# Patient Record
Sex: Male | Born: 1980 | Race: Black or African American | Hispanic: No | Marital: Single | Smoking: Former smoker
Health system: Southern US, Community
[De-identification: ages and names within clinical notes are randomized; demographics above are authoritative.]

## PROBLEM LIST (undated history)

## (undated) DIAGNOSIS — E079 Disorder of thyroid, unspecified: Secondary | ICD-10-CM

## (undated) DIAGNOSIS — J45909 Unspecified asthma, uncomplicated: Secondary | ICD-10-CM

## (undated) DIAGNOSIS — K859 Acute pancreatitis without necrosis or infection, unspecified: Secondary | ICD-10-CM

## (undated) HISTORY — DX: Disorder of thyroid, unspecified: E07.9

---

## 2002-12-30 ENCOUNTER — Emergency Department (HOSPITAL_COMMUNITY): Admission: EM | Admit: 2002-12-30 | Discharge: 2002-12-31 | Payer: Self-pay | Admitting: Emergency Medicine

## 2007-08-24 ENCOUNTER — Emergency Department (HOSPITAL_COMMUNITY): Admission: EM | Admit: 2007-08-24 | Discharge: 2007-08-24 | Payer: Self-pay | Admitting: Emergency Medicine

## 2011-07-16 ENCOUNTER — Encounter (HOSPITAL_COMMUNITY): Payer: Self-pay | Admitting: Emergency Medicine

## 2011-07-16 ENCOUNTER — Emergency Department (INDEPENDENT_AMBULATORY_CARE_PROVIDER_SITE_OTHER): Payer: 59

## 2011-07-16 ENCOUNTER — Emergency Department (HOSPITAL_COMMUNITY)
Admission: EM | Admit: 2011-07-16 | Discharge: 2011-07-16 | Disposition: A | Discharge status: Home / Self Care | Payer: 59 | Source: Home / Self Care | Attending: Family Medicine | Admitting: Family Medicine

## 2011-07-16 DIAGNOSIS — M62838 Other muscle spasm: Secondary | ICD-10-CM

## 2011-07-16 HISTORY — DX: Unspecified asthma, uncomplicated: J45.909

## 2011-07-16 MED ORDER — CYCLOBENZAPRINE HCL 10 MG PO TABS: 10.0000 mg | ORAL_TABLET | Freq: Three times a day (TID) | ORAL | Status: AC | PRN

## 2011-07-16 MED ORDER — IBUPROFEN 600 MG PO TABS: 600.0000 mg | ORAL_TABLET | Freq: Three times a day (TID) | ORAL | Status: AC

## 2011-07-16 NOTE — ED Notes (Addendum)
Pt here with c/o right clavicle knot without pain or bruising. r posterior shoulder blade discomfort that he noticed x4 dys ago.denies injury or strain.

## 2011-07-17 NOTE — ED Provider Notes (Signed)
History     CSN: [id number]  Arrival date & time 07/16/11  1110   First MD Initiated Contact with Patient 07/16/11 1123      Chief Complaint  Patient presents with  . Cyst    (Consider location/radiation/quality/duration/timing/severity/associated sxs/prior treatment) HPI Comments: 31 y/o male h/o asthma here c/o deformity in right collar bone. Also reports discomfort with movement in right upper back for 4 days. He was looking at the mirror due to discomfort in right upper back and noticed difference between collar bones. Denies clavicular pain. Denies recent falls or known trauma. Works in call center answering the phone. Has not taken any pain medications denies any other symptoms.    Past Medical History  Diagnosis Date  . Asthma     History reviewed. No pertinent past surgical history.  No family history on file.  History  Substance Use Topics  . Smoking status: Never Smoker   . Smokeless tobacco: Not on file  . Alcohol Use: Yes      Review of Systems  Constitutional: Negative for fever, chills, activity change, appetite change, fatigue and unexpected weight change.       10 systems reviewed and  pertinent negative and positive symptoms are as per HPI.     HENT: Negative for neck pain.   Cardiovascular: Negative for chest pain.  Musculoskeletal: Positive for back pain.       As per HPI.  Skin: Negative for rash.  Neurological: Negative for weakness.  All other systems reviewed and are negative.    Allergies  Review of patient's allergies indicates no known allergies.  Home Medications   Current Outpatient Rx  Name Route Sig Dispense Refill  . CYCLOBENZAPRINE HCL 10 MG PO TABS Oral Take 1 tablet (10 mg total) by mouth 3 (three) times daily as needed for muscle spasms. 20 tablet 0  . IBUPROFEN 600 MG PO TABS Oral Take 1 tablet (600 mg total) by mouth 3 (three) times daily. 20 tablet 0    BP 149/89  Pulse 112  Temp 97.8 F (36.6 C) (Oral)  Resp 16   SpO2 98%  Physical Exam  Nursing note and vitals reviewed. Constitutional: He is oriented to person, place, and time. He appears well-developed and well-nourished. No distress.  HENT:  Head: Normocephalic and atraumatic.  Mouth/Throat: Oropharynx is clear and moist. No oropharyngeal exudate.  Eyes: Conjunctivae and EOM are normal. Pupils are equal, round, and reactive to light. No scleral icterus.  Neck: Normal range of motion. Neck supple. No tracheal deviation present.  Cardiovascular: Normal rate, regular rhythm and normal heart sounds.   Pulmonary/Chest: Effort normal and breath sounds normal. No respiratory distress. He has no wheezes. He has no rales. He exhibits no tenderness.  Musculoskeletal:       There is asymmetry of right collar bone that appears more prominent distally while compared with left side. No associated swelling or bruising. No tenderness or crepitus to palpation.  Right shoulder with FROM. No prominent scapula while leaning against wall with palms supporting body weight. Right upper back: impress asymmetric contraction and tenderness to palpation of muscles between medial upper border of right scapula and thoracic vertebrae compared with left side. Although both scapula appears rotating symmetrically during arms abduction and adduction. Possible mild spine scoliosis with no kyphosis.    Neurological: He is alert and oriented to person, place, and time.  Skin: No rash noted.    ED Course  Procedures (including critical care time)  Labs Reviewed -  No data to display Dg Chest 1 View  07/16/2011  *RADIOLOGY REPORT*  Clinical Data: Right clavicle deformity  CHEST - 1 VIEW  Comparison: None.  Findings: The left and right medial clavicles appear normal without significant asymmetry. The sternum clavicular joints appear normal.  Normal mediastinum and cardiac silhouette.  Normal pulmonary vasculature.  No evidence of effusion, infiltrate, or pneumothorax. No acute bony  abnormality.  IMPRESSION: No radiographic abnormality of the clavicles.  Original Report Authenticated By: Genevive Bi, M.D.     1. Muscle spasm       MDM  Symptoms and clinical finding possible related to scoliosis. Treated with flexeril asked to follow up with orthopedic as needed.         Sharin Grave, MD 07/17/11 [id number]

## 2012-11-10 ENCOUNTER — Ambulatory Visit: Payer: Self-pay

## 2013-06-12 ENCOUNTER — Emergency Department (HOSPITAL_COMMUNITY)
Admission: EM | Admit: 2013-06-12 | Discharge: 2013-06-12 | Disposition: A | Discharge status: Home / Self Care | Payer: Managed Care, Other (non HMO) | Source: Home / Self Care | Attending: Family Medicine | Admitting: Family Medicine

## 2013-06-12 ENCOUNTER — Encounter (HOSPITAL_COMMUNITY): Payer: Self-pay | Admitting: Emergency Medicine

## 2013-06-12 DIAGNOSIS — K859 Acute pancreatitis without necrosis or infection, unspecified: Secondary | ICD-10-CM

## 2013-06-12 DIAGNOSIS — H6690 Otitis media, unspecified, unspecified ear: Secondary | ICD-10-CM

## 2013-06-12 LAB — COMPREHENSIVE METABOLIC PANEL
ALK PHOS: 198 U/L — AB (ref 39–117)
ALT: 35 U/L (ref 0–53)
AST: 23 U/L (ref 0–37)
Albumin: 3.7 g/dL (ref 3.5–5.2)
BILIRUBIN TOTAL: 1.1 mg/dL (ref 0.3–1.2)
BUN: 15 mg/dL (ref 6–23)
CHLORIDE: 105 meq/L (ref 96–112)
CO2: 24 meq/L (ref 19–32)
Calcium: 9.7 mg/dL (ref 8.4–10.5)
Creatinine, Ser: 0.52 mg/dL (ref 0.50–1.35)
GLUCOSE: 104 mg/dL — AB (ref 70–99)
POTASSIUM: 4 meq/L (ref 3.7–5.3)
SODIUM: 143 meq/L (ref 137–147)
TOTAL PROTEIN: 7.4 g/dL (ref 6.0–8.3)

## 2013-06-12 LAB — CBC
HEMATOCRIT: 36.7 % — AB (ref 39.0–52.0)
HEMOGLOBIN: 12 g/dL — AB (ref 13.0–17.0)
MCH: 26.9 pg (ref 26.0–34.0)
MCHC: 32.7 g/dL (ref 30.0–36.0)
MCV: 82.3 fL (ref 78.0–100.0)
Platelets: 206 10*3/uL (ref 150–400)
RBC: 4.46 MIL/uL (ref 4.22–5.81)
RDW: 13.5 % (ref 11.5–15.5)
WBC: 9.2 10*3/uL (ref 4.0–10.5)

## 2013-06-12 LAB — POCT RAPID STREP A: Streptococcus, Group A Screen (Direct): NEGATIVE

## 2013-06-12 LAB — LIPASE, BLOOD: LIPASE: 153 U/L — AB (ref 11–59)

## 2013-06-12 MED ORDER — ONDANSETRON HCL 4 MG/2ML IJ SOLN
4.0000 mg | Freq: Once | INTRAMUSCULAR | Status: AC
  Administered 2013-06-12: 4 mg via INTRAVENOUS

## 2013-06-12 MED ORDER — HYDROCODONE-ACETAMINOPHEN 5-325 MG PO TABS: 1.0000 | ORAL_TABLET | Freq: Four times a day (QID) | ORAL | Status: DC | PRN

## 2013-06-12 MED ORDER — ONDANSETRON HCL 4 MG/2ML IJ SOLN
INTRAMUSCULAR | Status: AC
  Filled 2013-06-12: qty 2

## 2013-06-12 MED ORDER — ANTIPYRINE-BENZOCAINE 5.4-1.4 % OT SOLN
3.0000 [drp] | OTIC | Status: DC | PRN
  Administered 2013-06-12: 3 [drp] via OTIC

## 2013-06-12 MED ORDER — SODIUM CHLORIDE 0.9 % IV BOLUS (SEPSIS)
1000.0000 mL | Freq: Once | INTRAVENOUS | Status: AC
Start: 2013-06-12 — End: 2013-06-12
  Administered 2013-06-12: 1000 mL via INTRAVENOUS

## 2013-06-12 MED ORDER — SODIUM CHLORIDE 0.9 % IV BOLUS (SEPSIS)
1000.0000 mL | Freq: Once | INTRAVENOUS | Status: AC
  Administered 2013-06-12: 1000 mL via INTRAVENOUS

## 2013-06-12 MED ORDER — AMOXICILLIN 500 MG PO CAPS: 1000.0000 mg | ORAL_CAPSULE | Freq: Two times a day (BID) | ORAL | Status: DC

## 2013-06-12 MED ORDER — ONDANSETRON HCL 8 MG PO TABS: 8.0000 mg | ORAL_TABLET | Freq: Three times a day (TID) | ORAL | Status: DC | PRN

## 2013-06-12 NOTE — ED Notes (Signed)
#  2 liter NSS hung at 999 ml/H

## 2013-06-12 NOTE — ED Provider Notes (Addendum)
Greg Patrick is a 33 y.o. male who presents to Urgent Care today for ear pain vomiting and fever and fatigue. Symptoms present for one day. Ibuprofen is not helped. No diarrhea chest pain palpitations or shortness of breath. Patient feels well otherwise.   Past Medical History  Diagnosis Date  . Asthma    History  Substance Use Topics  . Smoking status: Current Every Day Smoker  . Smokeless tobacco: Not on file  . Alcohol Use: Yes   ROS as above Medications: Current Facility-Administered Medications  Medication Dose Route Frequency Provider Last Rate Last Dose  . antipyrine-benzocaine (AURALGAN) otic solution 3-4 drop  3-4 drop Right Ear Q2H PRN Rodolph BongEvan S Becka Lagasse, MD   3 drop at 06/12/13 1216   Current Outpatient Prescriptions  Medication Sig Dispense Refill  . amoxicillin (AMOXIL) 500 MG capsule Take 2 capsules (1,000 mg total) by mouth 2 (two) times daily.  28 capsule  0  . HYDROcodone-acetaminophen (NORCO/VICODIN) 5-325 MG per tablet Take 1 tablet by mouth every 6 (six) hours as needed.  15 tablet  0  . ondansetron (ZOFRAN) 8 MG tablet Take 1 tablet (8 mg total) by mouth every 8 (eight) hours as needed for nausea or vomiting.  20 tablet  0    Exam:  BP 147/80  Pulse 132  Temp(Src) 101.6 F (38.7 C) (Oral)  Resp 26  SpO2 96% Filed Vitals:   06/12/13 1128 06/12/13 1353  BP: 163/83 147/80  Pulse: 94 132  Temp: 98.4 F (36.9 C) 101.6 F (38.7 C)  TempSrc: Oral Oral  Resp: 20 26  SpO2: 96%     Heart rate as high as 130 beats per minute Gen: Well NAD HEENT: EOMI,  MMM right tympanic membrane is erythematous without effusion left is normal. Posterior pharynx normal-appearing. Lungs: Normal work of breathing. CTABL Heart: RRR no MRG Abd: NABS, Soft.  ND to palpation epigastric area no rebound or guarding. No CVA tenderness to percussion Exts: Brisk capillary refill, warm and well perfused.   Patient was given 2 L of normal saline. He noted mild improvement he remained  significantly tachycardic.  Results for orders placed during the hospital encounter of 06/12/13 (from the past 24 hour(s))  CBC     Status: Abnormal   Collection Time    06/12/13 12:01 PM      Result Value Ref Range   WBC 9.2  4.0 - 10.5 K/uL   RBC 4.46  4.22 - 5.81 MIL/uL   Hemoglobin 12.0 (*) 13.0 - 17.0 g/dL   HCT 16.136.7 (*) 09.639.0 - 04.552.0 %   MCV 82.3  78.0 - 100.0 fL   MCH 26.9  26.0 - 34.0 pg   MCHC 32.7  30.0 - 36.0 g/dL   RDW 40.913.5  81.111.5 - 91.415.5 %   Platelets 206  150 - 400 K/uL  COMPREHENSIVE METABOLIC PANEL     Status: Abnormal   Collection Time    06/12/13 12:01 PM      Result Value Ref Range   Sodium 143  137 - 147 mEq/L   Potassium 4.0  3.7 - 5.3 mEq/L   Chloride 105  96 - 112 mEq/L   CO2 24  19 - 32 mEq/L   Glucose, Bld 104 (*) 70 - 99 mg/dL   BUN 15  6 - 23 mg/dL   Creatinine, Ser 7.820.52  0.50 - 1.35 mg/dL   Calcium 9.7  8.4 - 95.610.5 mg/dL   Total Protein 7.4  6.0 - 8.3 g/dL  Albumin 3.7  3.5 - 5.2 g/dL   AST 23  0 - 37 U/L   ALT 35  0 - 53 U/L   Alkaline Phosphatase 198 (*) 39 - 117 U/L   Total Bilirubin 1.1  0.3 - 1.2 mg/dL   GFR calc non Af Amer >90  >90 mL/min   GFR calc Af Amer >90  >90 mL/min  LIPASE, BLOOD     Status: Abnormal   Collection Time    06/12/13 12:01 PM      Result Value Ref Range   Lipase 153 (*) 11 - 59 U/L  POCT RAPID STREP A ([facility] ONLY)     Status: None   Collection Time    06/12/13 12:26 PM      Result Value Ref Range   Streptococcus, Group A Screen (Direct) NEGATIVE  NEGATIVE   No results found.  Assessment and Plan: 33 y.o. male with  pancreatitis. Patient failed to improve following 2 L of normal saline. He is febrile and tachycardic. Plan to transfer to the emergency room for further evaluation and management.   Rodolph Bong, MD 06/12/13 1357   Addendum: Patient refused transfer. He agrees to return tomorrow morning for recheck and reevaluation. Discussed warning signs or symptoms. Expressed understanding and  agreement.  Rodolph Bong, MD 06/12/13 [phone]

## 2013-06-12 NOTE — ED Notes (Signed)
MD advised of new VS readings

## 2013-06-12 NOTE — ED Notes (Signed)
Pain in right ear, vomiting since this AM. NAD

## 2013-06-12 NOTE — ED Notes (Signed)
MD advised of change in VS, MD advised pt of needs being best served in ED. Patient declined to go to ED presently. Both he and wife have been instructed to go to ED if condition worsens in any way, if new syx develop, or if he is unable to keep down PO fluids. They seemed agreeable to this plan

## 2013-06-12 NOTE — Discharge Instructions (Signed)
Thank you for coming in today. Clear liquid diet.  Return tomorrow morning.  Go to the Emergency Room if you worsen.  If your belly pain worsens, or you have high fever, bad vomiting, blood in your stool or black tarry stool go to the Emergency Room.    Acute Pancreatitis Acute pancreatitis is a disease in which the pancreas becomes suddenly inflamed. The pancreas is a large gland located behind your stomach. The pancreas produces enzymes that help digest food. The pancreas also releases the hormones glucagon and insulin that help regulate blood sugar. Damage to the pancreas occurs when the digestive enzymes from the pancreas are activated and begin attacking the pancreas before being released into the intestine. Most acute attacks last a couple of days and can cause serious complications. Some people become dehydrated and develop low blood pressure. In severe cases, bleeding into the pancreas can lead to shock and can be life-threatening. The lungs, heart, and kidneys may fail. CAUSES  Pancreatitis can happen to anyone. In some cases, the cause is unknown. Most cases are caused by:  Alcohol abuse.  Gallstones. Other less common causes are:  Certain medicines.  Exposure to certain chemicals.  Infection.  Damage caused by an accident (trauma).  Abdominal surgery. SYMPTOMS   Pain in the upper abdomen that may radiate to the back.  Tenderness and swelling of the abdomen.  Nausea and vomiting. DIAGNOSIS  Your caregiver will perform a physical exam. Blood and stool tests may be done to confirm the diagnosis. Imaging tests may also be done, such as X-rays, CT scans, or an ultrasound of the abdomen. TREATMENT  Treatment usually requires a stay in the hospital. Treatment may include:  Pain medicine.  Fluid replacement through an intravenous line (IV).  Placing a tube in the stomach to remove stomach contents and control vomiting.  Not eating for 3 or 4 days. This gives your  pancreas a rest, because enzymes are not being produced that can cause further damage.  Antibiotic medicines if your condition is caused by an infection.  Surgery of the pancreas or gallbladder. HOME CARE INSTRUCTIONS   Follow the diet advised by your caregiver. This may involve avoiding alcohol and decreasing the amount of fat in your diet.  Eat smaller, more frequent meals. This reduces the amount of digestive juices the pancreas produces.  Drink enough fluids to keep your urine clear or pale yellow.  Only take over-the-counter or prescription medicines as directed by your caregiver.  Avoid drinking alcohol if it caused your condition.  Do not smoke.  Get plenty of rest.  Check your blood sugar at home as directed by your caregiver.  Keep all follow-up appointments as directed by your caregiver. SEEK MEDICAL CARE IF:   You do not recover as quickly as expected.  You develop new or worsening symptoms.  You have persistent pain, weakness, or nausea.  You recover and then have another episode of pain. SEEK IMMEDIATE MEDICAL CARE IF:   You are unable to eat or keep fluids down.  Your pain becomes severe.  You have a fever or persistent symptoms for more than 2 to 3 days.  You have a fever and your symptoms suddenly get worse.  Your skin or the white part of your eyes turn yellow (jaundice).  You develop vomiting.  You feel dizzy, or you faint.  Your blood sugar is high (over 300 mg/dL). MAKE SURE YOU:   Understand these instructions.  Will watch your condition.  Will get  help right away if you are not doing well or get worse. Document Released: 12/21/2004 Document Revised: 06/22/2011 Document Reviewed: 04/01/2011 [facility] Patient Information 2014 [place], [place].  Diet The clear liquid diet consists of foods that are liquid or will become liquid at room temperature. Examples of foods allowed on a clear liquid diet include fruit juice, broth or bouillon,  gelatin, or frozen ice pops. You should be able to see through the liquid. The purpose of this diet is to provide the necessary fluids, electrolytes (such as sodium and potassium), and energy to keep the body functioning during times when you are not able to consume a regular diet. A clear liquid diet should not be continued for long periods of time, as it is not nutritionally adequate.  A CLEAR LIQUID DIET MAY BE NEEDED: When a sudden-onset (acute) condition occurs before or after surgery.  As the first step in oral feeding.  For fluid and electrolyte replacement in diarrheal diseases.  As a diet before certain medical tests are performed.  ADEQUACY The clear liquid diet is adequate only in ascorbic acid, according to the Recommended Dietary Allowances of the [employer].  CHOOSING FOODS Breads and Starches Allowed: None are allowed.  Avoid: All are to be avoided.  Vegetables Allowed: Strained vegetable juices.  Avoid: Any others.  Fruit Allowed: Strained fruit juices and fruit drinks. Include 1 serving of citrus or vitamin C-enriched fruit juice daily.  Avoid: Any others.  Meat and Meat Substitutes Allowed: None are allowed.  Avoid: All are to be avoided.  Milk Products Allowed: None are allowed.  Avoid: All are to be avoided.  Soups and Combination Foods Allowed: Clear bouillon, broth, or strained broth-based soups.  Avoid: Any others.  Desserts and Sweets Allowed: Sugar, honey. High-protein gelatin. Flavored gelatin, ices, or frozen ice pops that do not contain milk.  Avoid: Any others.  Fats and Oils Allowed: None are allowed.  Avoid: All are to be avoided.  Beverages Allowed: Cereal beverages, coffee (regular or decaffeinated), tea, or soda at the discretion of your health care provider.  Avoid: Any others.  Condiments Allowed: Salt.  Avoid: Any others, including pepper.  Supplements Allowed: Liquid  nutrition beverages that you can see through.  Avoid: Any others that contain lactose or fiber. SAMPLE MEAL PLAN Breakfast 4 oz (120 mL) strained orange juice.  to 1 cup (120 to 240 mL) gelatin (plain or fortified). 1 cup (240 mL) beverage (coffee or tea). Sugar, if desired. Midmorning Snack  cup (120 mL) gelatin (plain or fortified). Lunch 1 cup (240 mL) broth or consomm. 4 oz (120 mL) strained grapefruit juice.  cup (120 mL) gelatin (plain or fortified). 1 cup (240 mL) beverage (coffee or tea). Sugar, if desired. Midafternoon Snack  cup (120 mL) fruit ice.  cup (120 mL) strained fruit juice. Dinner 1 cup (240 mL) broth or consomm.  cup (120 mL) cranberry juice.  cup (120 mL) flavored gelatin (plain or fortified). 1 cup (240 mL) beverage (coffee or tea). Sugar, if desired. Evening Snack 4 oz (120 mL) strained apple juice (vitamin C-fortified).  cup (120 mL) flavored gelatin (plain or fortified). MAKE SURE YOU: Understand these instructions. Will watch your child's condition. Will get help right away if your child is not doing well or gets worse. Document Released: 12/21/2004 Document Revised: 08/23/2012 Document Reviewed: 05/23/2012 [facility] Patient Information 2014 [place], [place].

## 2013-06-14 ENCOUNTER — Emergency Department (HOSPITAL_COMMUNITY): Payer: Managed Care, Other (non HMO)

## 2013-06-14 ENCOUNTER — Emergency Department (HOSPITAL_COMMUNITY)
Admission: EM | Admit: 2013-06-14 | Discharge: 2013-06-15 | Disposition: A | Discharge status: Home / Self Care | Payer: Managed Care, Other (non HMO) | Attending: Emergency Medicine | Admitting: Emergency Medicine

## 2013-06-14 ENCOUNTER — Encounter (HOSPITAL_COMMUNITY): Payer: Self-pay | Admitting: Emergency Medicine

## 2013-06-14 DIAGNOSIS — R1011 Right upper quadrant pain: Secondary | ICD-10-CM | POA: Insufficient documentation

## 2013-06-14 DIAGNOSIS — H6690 Otitis media, unspecified, unspecified ear: Secondary | ICD-10-CM | POA: Insufficient documentation

## 2013-06-14 DIAGNOSIS — K839 Disease of biliary tract, unspecified: Secondary | ICD-10-CM

## 2013-06-14 DIAGNOSIS — R509 Fever, unspecified: Secondary | ICD-10-CM | POA: Insufficient documentation

## 2013-06-14 DIAGNOSIS — H6091 Unspecified otitis externa, right ear: Secondary | ICD-10-CM

## 2013-06-14 DIAGNOSIS — K838 Other specified diseases of biliary tract: Secondary | ICD-10-CM | POA: Insufficient documentation

## 2013-06-14 DIAGNOSIS — Z8719 Personal history of other diseases of the digestive system: Secondary | ICD-10-CM | POA: Insufficient documentation

## 2013-06-14 DIAGNOSIS — R000 Tachycardia, unspecified: Secondary | ICD-10-CM | POA: Insufficient documentation

## 2013-06-14 DIAGNOSIS — R112 Nausea with vomiting, unspecified: Secondary | ICD-10-CM | POA: Insufficient documentation

## 2013-06-14 DIAGNOSIS — F17200 Nicotine dependence, unspecified, uncomplicated: Secondary | ICD-10-CM | POA: Insufficient documentation

## 2013-06-14 DIAGNOSIS — R1013 Epigastric pain: Secondary | ICD-10-CM | POA: Insufficient documentation

## 2013-06-14 DIAGNOSIS — J45909 Unspecified asthma, uncomplicated: Secondary | ICD-10-CM | POA: Insufficient documentation

## 2013-06-14 HISTORY — DX: Acute pancreatitis without necrosis or infection, unspecified: K85.90

## 2013-06-14 LAB — URINALYSIS, ROUTINE W REFLEX MICROSCOPIC
Glucose, UA: NEGATIVE mg/dL
HGB URINE DIPSTICK: NEGATIVE
Ketones, ur: NEGATIVE mg/dL
Leukocytes, UA: NEGATIVE
NITRITE: NEGATIVE
PROTEIN: 30 mg/dL — AB
SPECIFIC GRAVITY, URINE: 1.026 (ref 1.005–1.030)
UROBILINOGEN UA: 1 mg/dL (ref 0.0–1.0)
pH: 5.5 (ref 5.0–8.0)

## 2013-06-14 LAB — CULTURE, GROUP A STREP

## 2013-06-14 LAB — URINE MICROSCOPIC-ADD ON

## 2013-06-14 LAB — CBC WITH DIFFERENTIAL/PLATELET
Basophils Absolute: 0 10*3/uL (ref 0.0–0.1)
Basophils Relative: 0 % (ref 0–1)
EOS ABS: 0 10*3/uL (ref 0.0–0.7)
EOS PCT: 0 % (ref 0–5)
HCT: 34.6 % — ABNORMAL LOW (ref 39.0–52.0)
HEMOGLOBIN: 11.3 g/dL — AB (ref 13.0–17.0)
LYMPHS ABS: 1.1 10*3/uL (ref 0.7–4.0)
LYMPHS PCT: 17 % (ref 12–46)
MCH: 26.7 pg (ref 26.0–34.0)
MCHC: 32.7 g/dL (ref 30.0–36.0)
MCV: 81.6 fL (ref 78.0–100.0)
MONOS PCT: 13 % — AB (ref 3–12)
Monocytes Absolute: 0.9 10*3/uL (ref 0.1–1.0)
Neutro Abs: 4.7 10*3/uL (ref 1.7–7.7)
Neutrophils Relative %: 70 % (ref 43–77)
Platelets: 209 10*3/uL (ref 150–400)
RBC: 4.24 MIL/uL (ref 4.22–5.81)
RDW: 13.6 % (ref 11.5–15.5)
WBC: 6.7 10*3/uL (ref 4.0–10.5)

## 2013-06-14 LAB — COMPREHENSIVE METABOLIC PANEL
ALK PHOS: 150 U/L — AB (ref 39–117)
ALT: 57 U/L — AB (ref 0–53)
AST: 37 U/L (ref 0–37)
Albumin: 3.4 g/dL — ABNORMAL LOW (ref 3.5–5.2)
BUN: 9 mg/dL (ref 6–23)
CALCIUM: 9.4 mg/dL (ref 8.4–10.5)
CO2: 24 meq/L (ref 19–32)
Chloride: 105 mEq/L (ref 96–112)
Creatinine, Ser: 0.5 mg/dL (ref 0.50–1.35)
GLUCOSE: 104 mg/dL — AB (ref 70–99)
POTASSIUM: 3.6 meq/L — AB (ref 3.7–5.3)
SODIUM: 143 meq/L (ref 137–147)
TOTAL PROTEIN: 7.3 g/dL (ref 6.0–8.3)
Total Bilirubin: 1 mg/dL (ref 0.3–1.2)

## 2013-06-14 LAB — LIPASE, BLOOD: LIPASE: 15 U/L (ref 11–59)

## 2013-06-14 MED ORDER — SODIUM CHLORIDE 0.9 % IV BOLUS (SEPSIS)
1000.0000 mL | Freq: Once | INTRAVENOUS | Status: AC
  Administered 2013-06-14: 1000 mL via INTRAVENOUS

## 2013-06-14 MED ORDER — IOHEXOL 300 MG/ML  SOLN
50.0000 mL | Freq: Once | INTRAMUSCULAR | Status: AC | PRN
  Administered 2013-06-14: 50 mL via ORAL

## 2013-06-14 MED ORDER — IOHEXOL 300 MG/ML  SOLN
80.0000 mL | Freq: Once | INTRAMUSCULAR | Status: AC | PRN
  Administered 2013-06-14: 80 mL via INTRAVENOUS

## 2013-06-14 NOTE — ED Notes (Signed)
Patient back from CT dept Patient in NAD Awaiting CT results

## 2013-06-14 NOTE — ED Notes (Signed)
Throat culture: Strep beta hemolytic not group A.  Pt. adequately treated with Amoxicillin. Greg Patrick 06/14/2013

## 2013-06-14 NOTE — ED Notes (Signed)
Pt seen on Monday at Urgent care- treated for pancreatitis and rt ear infection. Pt here for re-evaluation of these complaints. C/o of N/V/and fever. Fever controlled with motrin.

## 2013-06-14 NOTE — ED Notes (Signed)
Patient rates RUQ pain 7/10 Patient initially declined pain medication or to have EDP informed This nurse reminded patient that during [place] that palpation to abdomen would occur Patient now states that he would like pain medication EDP made aware

## 2013-06-14 NOTE — ED Provider Notes (Signed)
CSN: [id number]     Arrival date & time 06/14/13  1831 History   First MD Initiated Contact with Patient 06/14/13 2105     Chief Complaint  Patient presents with  . Abdominal Pain  . Otalgia  . Ear Drainage  . Nausea  . Emesis     (Consider location/radiation/quality/duration/timing/severity/associated sxs/prior Treatment) Patient is a 33 y.o. male presenting with abdominal pain, ear pain, ear drainage, and vomiting. The history is provided by the patient. No language interpreter was used.  Abdominal Pain Pain location:  LUQ Pain quality: aching   Pain radiates to:  Does not radiate Pain severity:  Moderate Onset quality:  Gradual Duration:  4 days Timing:  Constant Progression:  Worsening Chronicity:  New Context: alcohol use   Context comment:  Former Ineffective treatments:  NSAIDs Associated symptoms: fever, nausea and vomiting   Risk factors comment:     Otalgia Location:  Right Quality:  Throbbing Progression:  Waxing and waning Chronicity:  New Associated symptoms: abdominal pain, fever and vomiting   Ear Drainage Associated symptoms include abdominal pain, a fever, nausea and vomiting.  Emesis Associated symptoms: abdominal pain     Past Medical History  Diagnosis Date  . Asthma   . Pancreatitis    History reviewed. No pertinent past surgical history. No family history on file. History  Substance Use Topics  . Smoking status: Current Every Day Smoker  . Smokeless tobacco: Not on file  . Alcohol Use: Yes    Review of Systems  Constitutional: Positive for fever.  HENT: Positive for ear pain.   Gastrointestinal: Positive for nausea, vomiting and abdominal pain.  All other systems reviewed and are negative.     Allergies  Review of patient's allergies indicates no known allergies.  Home Medications   Prior to Admission medications   Medication Sig Start Date End Date Taking? Authorizing Provider  amoxicillin (AMOXIL) 500 MG capsule Take 2  capsules (1,000 mg total) by mouth 2 (two) times daily. 06/12/13   Rodolph Bong, MD  HYDROcodone-acetaminophen (NORCO/VICODIN) 5-325 MG per tablet Take 1 tablet by mouth every 6 (six) hours as needed. 06/12/13   Rodolph Bong, MD  ondansetron (ZOFRAN) 8 MG tablet Take 1 tablet (8 mg total) by mouth every 8 (eight) hours as needed for nausea or vomiting. 06/12/13   Rodolph Bong, MD   BP 146/80  Pulse 109  Temp(Src) 98.1 F (36.7 C) (Oral)  Resp 18  Ht 5\' 11"  (1.803 m)  Wt 163 lb (73.936 kg)  BMI 22.74 kg/m2  SpO2 98% Physical Exam  Nursing note and vitals reviewed. Constitutional: He is oriented to person, place, and time. He appears well-developed and well-nourished.  HENT:  Head: Normocephalic.  Right Ear: There is drainage, swelling and tenderness.  External canal edematous.  Eyes: Pupils are equal, round, and reactive to light.  Neck: Normal range of motion.  Cardiovascular: Intact distal pulses.  Tachycardia present.   Pulmonary/Chest: Effort normal and breath sounds normal.  Abdominal: Soft. There is tenderness.  Musculoskeletal: He exhibits no edema and no tenderness.  Lymphadenopathy:    He has no cervical adenopathy.  Neurological: He is alert and oriented to person, place, and time.  Skin: Skin is warm and dry.  Psychiatric: He has a normal mood and affect.    ED Course  Procedures (including critical care time) Labs Review Labs Reviewed  CBC WITH DIFFERENTIAL - Abnormal; Notable for the following:    Hemoglobin 11.3 (*)  HCT 34.6 (*)    Monocytes Relative 13 (*)    All other components within normal limits  URINALYSIS, ROUTINE W REFLEX MICROSCOPIC - Abnormal; Notable for the following:    Color, Urine AMBER (*)    Bilirubin Urine SMALL (*)    Protein, ur 30 (*)    All other components within normal limits  URINE MICROSCOPIC-ADD ON - Abnormal; Notable for the following:    Casts WBC CAST (*)    All other components within normal limits  COMPREHENSIVE METABOLIC  PANEL  LIPASE, BLOOD    Imaging Review No results found.   EKG Interpretation None     Radiology and lab results reviewed and shared with patient.  No leukocytosis or fever.  Improved lipase.  ALT 54, AlkPhos 150.  Ultrasound RUQ reveals GB wall thickening with pericholecystic edema without stones or sludge.  Patient remains mildly tachycardic, normotensive.  Patient reports history of persistent tachycardia.  Patient has adequate pain and nausea medication at home.  Will add ciprodex to otalgia treatment regimen.  Follow-up with GI, [facility], and PCP. MDM   Final diagnoses:  None    Otitis externa. Epigastric pain.    Jimmye Normanavid John Osiah Haring, NP 06/15/13 [phone]

## 2013-06-15 ENCOUNTER — Telehealth (HOSPITAL_COMMUNITY): Payer: Self-pay | Admitting: *Deleted

## 2013-06-15 ENCOUNTER — Emergency Department (HOSPITAL_COMMUNITY): Payer: Managed Care, Other (non HMO)

## 2013-06-15 MED ORDER — CIPROFLOXACIN-DEXAMETHASONE 0.3-0.1 % OT SUSP: 4.0000 [drp] | Freq: Two times a day (BID) | OTIC | Status: DC

## 2013-06-15 MED ORDER — HYDROMORPHONE HCL PF 1 MG/ML IJ SOLN
1.0000 mg | Freq: Once | INTRAMUSCULAR | Status: AC
  Administered 2013-06-15: 1 mg via INTRAVENOUS
  Filled 2013-06-15: qty 1

## 2013-06-15 MED ORDER — ONDANSETRON HCL 4 MG/2ML IJ SOLN
4.0000 mg | Freq: Once | INTRAMUSCULAR | Status: AC
  Administered 2013-06-15: 4 mg via INTRAVENOUS
  Filled 2013-06-15: qty 2

## 2013-06-15 MED ORDER — MORPHINE SULFATE 4 MG/ML IJ SOLN: 4.0000 mg | Freq: Once | INTRAMUSCULAR | Status: DC

## 2013-06-15 NOTE — ED Notes (Signed)
US at bedside

## 2013-06-15 NOTE — ED Notes (Addendum)
I called and left a message to call. Call 1. Greg Patrick, Greg Patrick 06/15/2013 Left message.  Call 2. 06/18/2013 Left message.  Call 3. 06/20/2013 Unable to reach pt. by phone x 3.  Confidential marked letter with lab results sent to pt. Greg Patrick, Greg Patrick 06/21/2013

## 2013-06-15 NOTE — ED Notes (Signed)
Patient resting in position of comfort with eyes closed s/p admin of pain medication, see MAR RR WNL--even and unlabored with equal rise and fall of chest Patient in NAD Side rails up, call bell in reach  Awaiting [place]

## 2013-06-15 NOTE — ED Notes (Signed)
US completed Patient asking for water Patient informed that he needs to remain NPO until US results come back and are reviewed by EDP--patient agreed and v/u to plan of care Patient denied further needs or complaints at this time Side rails up, call bell in reach

## 2013-06-15 NOTE — Discharge Instructions (Signed)
Abdominal Pain, Adult Many things can cause abdominal pain. Usually, abdominal pain is not caused by a disease and will improve without treatment. It can often be observed and treated at home. Your health care provider will do a physical exam and possibly order blood tests and X-rays to help determine the seriousness of your pain. However, in many cases, more time must pass before a clear cause of the pain can be found. Before that point, your health care provider may not know if you need more testing or further treatment. HOME CARE INSTRUCTIONS  Monitor your abdominal pain for any changes. The following actions may help to alleviate any discomfort you are experiencing:  Only take over-the-counter or prescription medicines as directed by your health care provider.  Do not take laxatives unless directed to do so by your health care provider.  Try a clear liquid diet (broth, tea, or water) as directed by your health care provider. Slowly move to a bland diet as tolerated. SEEK MEDICAL CARE IF:  You have unexplained abdominal pain.  You have abdominal pain associated with nausea or diarrhea.  You have pain when you urinate or have a bowel movement.  You experience abdominal pain that wakes you in the night.  You have abdominal pain that is worsened or improved by eating food.  You have abdominal pain that is worsened with eating fatty foods. SEEK IMMEDIATE MEDICAL CARE IF:   Your pain does not go away within 2 hours.  You have a fever.  You keep throwing up (vomiting).  Your pain is felt only in portions of the abdomen, such as the right side or the left lower portion of the abdomen.  You pass bloody or black tarry stools. MAKE SURE YOU:  Understand these instructions.   Will watch your condition.   Will get help right away if you are not doing well or get worse.  Document Released: 09/30/2004 Document Revised: 10/11/2012 Document Reviewed: 08/30/2012 [facility] Patient  Information 2014 [place], [place]. Otitis Externa Otitis externa is a bacterial or fungal infection of the outer ear canal. This is the area from the eardrum to the outside of the ear. Otitis externa is sometimes called "swimmer's ear." CAUSES  Possible causes of infection include:  Swimming in dirty water.  Moisture remaining in the ear after swimming or bathing.  Mild injury (trauma) to the ear.  Objects stuck in the ear (foreign body).  Cuts or scrapes (abrasions) on the outside of the ear. SYMPTOMS  The first symptom of infection is often itching in the ear canal. Later signs and symptoms may include swelling and redness of the ear canal, ear pain, and yellowish-white fluid (pus) coming from the ear. The ear pain may be worse when pulling on the earlobe. DIAGNOSIS  Your caregiver will perform a physical exam. A sample of fluid may be taken from the ear and examined for bacteria or fungi. TREATMENT  Antibiotic ear drops are often given for 10 to 14 days. Treatment may also include pain medicine or corticosteroids to reduce itching and swelling. PREVENTION   Keep your ear dry. Use the corner of a towel to absorb water out of the ear canal after swimming or bathing.  Avoid scratching or putting objects inside your ear. This can damage the ear canal or remove the protective wax that lines the canal. This makes it easier for bacteria and fungi to grow.  Avoid swimming in lakes, polluted water, or poorly chlorinated pools.  You may use ear drops  made of rubbing alcohol and vinegar after swimming. Combine equal parts of white vinegar and alcohol in a bottle. Put 3 or 4 drops into each ear after swimming. HOME CARE INSTRUCTIONS   Apply antibiotic ear drops to the ear canal as prescribed by your caregiver.  Only take over-the-counter or prescription medicines for pain, discomfort, or fever as directed by your caregiver.  If you have diabetes, follow any additional treatment instructions  from your caregiver.  Keep all follow-up appointments as directed by your caregiver. SEEK MEDICAL CARE IF:   You have a fever.  Your ear is still red, swollen, painful, or draining pus after 3 days.  Your redness, swelling, or pain gets worse.  You have a severe headache.  You have redness, swelling, pain, or tenderness in the area behind your ear. MAKE SURE YOU:   Understand these instructions.  Will watch your condition.  Will get help right away if you are not doing well or get worse. Document Released: 12/21/2004 Document Revised: 03/15/2011 Document Reviewed: 01/07/2011 [facility] Patient Information 2014 [place], [place].

## 2013-06-16 NOTE — ED Provider Notes (Signed)
Medical screening examination/treatment/procedure(s) were conducted as a shared visit with non-physician practitioner(s) and myself.  I personally evaluated the patient during the encounter.   EKG Interpretation None      Patient here with epigastric pain. On exam, only epigastric pain, no RUQ pain. CT shows pericholecystic fluid. No white count, no fever. Will do RUQ US.  Dagmar HaitWilliam Bonnie Overdorf, MD 06/16/13 [phone]

## 2014-05-24 ENCOUNTER — Inpatient Hospital Stay (HOSPITAL_COMMUNITY)
Admission: EM | Admit: 2014-05-24 | Discharge: 2014-05-26 | DRG: 644 | Disposition: A | Discharge status: Home / Self Care | Payer: PRIVATE HEALTH INSURANCE | Attending: Internal Medicine | Admitting: Internal Medicine

## 2014-05-24 ENCOUNTER — Emergency Department (HOSPITAL_COMMUNITY): Payer: PRIVATE HEALTH INSURANCE

## 2014-05-24 ENCOUNTER — Encounter (HOSPITAL_COMMUNITY): Payer: Self-pay

## 2014-05-24 DIAGNOSIS — I517 Cardiomegaly: Secondary | ICD-10-CM | POA: Diagnosis present

## 2014-05-24 DIAGNOSIS — E049 Nontoxic goiter, unspecified: Secondary | ICD-10-CM | POA: Diagnosis present

## 2014-05-24 DIAGNOSIS — R000 Tachycardia, unspecified: Secondary | ICD-10-CM

## 2014-05-24 DIAGNOSIS — I429 Cardiomyopathy, unspecified: Secondary | ICD-10-CM

## 2014-05-24 DIAGNOSIS — R0602 Shortness of breath: Secondary | ICD-10-CM

## 2014-05-24 DIAGNOSIS — F17210 Nicotine dependence, cigarettes, uncomplicated: Secondary | ICD-10-CM | POA: Diagnosis present

## 2014-05-24 DIAGNOSIS — R0789 Other chest pain: Secondary | ICD-10-CM

## 2014-05-24 DIAGNOSIS — R9431 Abnormal electrocardiogram [ECG] [EKG]: Secondary | ICD-10-CM | POA: Diagnosis present

## 2014-05-24 DIAGNOSIS — Z79899 Other long term (current) drug therapy: Secondary | ICD-10-CM

## 2014-05-24 DIAGNOSIS — R1013 Epigastric pain: Secondary | ICD-10-CM

## 2014-05-24 DIAGNOSIS — Z791 Long term (current) use of non-steroidal anti-inflammatories (NSAID): Secondary | ICD-10-CM

## 2014-05-24 DIAGNOSIS — G737 Myopathy in diseases classified elsewhere: Secondary | ICD-10-CM | POA: Diagnosis present

## 2014-05-24 DIAGNOSIS — J45909 Unspecified asthma, uncomplicated: Secondary | ICD-10-CM | POA: Diagnosis present

## 2014-05-24 DIAGNOSIS — E0500 Thyrotoxicosis with diffuse goiter without thyrotoxic crisis or storm: Principal | ICD-10-CM | POA: Diagnosis present

## 2014-05-24 DIAGNOSIS — E0590 Thyrotoxicosis, unspecified without thyrotoxic crisis or storm: Secondary | ICD-10-CM | POA: Diagnosis present

## 2014-05-24 DIAGNOSIS — R079 Chest pain, unspecified: Secondary | ICD-10-CM | POA: Diagnosis not present

## 2014-05-24 DIAGNOSIS — I43 Cardiomyopathy in diseases classified elsewhere: Secondary | ICD-10-CM | POA: Diagnosis present

## 2014-05-24 DIAGNOSIS — I4581 Long QT syndrome: Secondary | ICD-10-CM | POA: Diagnosis present

## 2014-05-24 DIAGNOSIS — E010 Iodine-deficiency related diffuse (endemic) goiter: Secondary | ICD-10-CM

## 2014-05-24 LAB — COMPREHENSIVE METABOLIC PANEL
ALK PHOS: 248 U/L — AB (ref 38–126)
ALT: 25 U/L (ref 17–63)
AST: 24 U/L (ref 15–41)
Albumin: 3.8 g/dL (ref 3.5–5.0)
Anion gap: 10 (ref 5–15)
BUN: 12 mg/dL (ref 6–20)
CO2: 25 mmol/L (ref 22–32)
Calcium: 9 mg/dL (ref 8.9–10.3)
Chloride: 108 mmol/L (ref 101–111)
Creatinine, Ser: 0.61 mg/dL (ref 0.61–1.24)
Glucose, Bld: 107 mg/dL — ABNORMAL HIGH (ref 65–99)
Potassium: 3.8 mmol/L (ref 3.5–5.1)
Sodium: 143 mmol/L (ref 135–145)
Total Bilirubin: 0.9 mg/dL (ref 0.3–1.2)
Total Protein: 7.5 g/dL (ref 6.5–8.1)

## 2014-05-24 LAB — URINALYSIS, ROUTINE W REFLEX MICROSCOPIC
Bilirubin Urine: NEGATIVE
GLUCOSE, UA: NEGATIVE mg/dL
HGB URINE DIPSTICK: NEGATIVE
Ketones, ur: NEGATIVE mg/dL
LEUKOCYTES UA: NEGATIVE
Nitrite: NEGATIVE
Protein, ur: NEGATIVE mg/dL
SPECIFIC GRAVITY, URINE: 1.009 (ref 1.005–1.030)
Urobilinogen, UA: 1 mg/dL (ref 0.0–1.0)
pH: 7 (ref 5.0–8.0)

## 2014-05-24 LAB — CBC
HEMATOCRIT: 40.4 % (ref 39.0–52.0)
HEMOGLOBIN: 12.8 g/dL — AB (ref 13.0–17.0)
MCH: 26.4 pg (ref 26.0–34.0)
MCHC: 31.7 g/dL (ref 30.0–36.0)
MCV: 83.5 fL (ref 78.0–100.0)
Platelets: 176 10*3/uL (ref 150–400)
RBC: 4.84 MIL/uL (ref 4.22–5.81)
RDW: 14.7 % (ref 11.5–15.5)
WBC: 5.1 10*3/uL (ref 4.0–10.5)

## 2014-05-24 LAB — D-DIMER, QUANTITATIVE: D-Dimer, Quant: 0.62 ug/mL-FEU — ABNORMAL HIGH (ref 0.00–0.48)

## 2014-05-24 LAB — BRAIN NATRIURETIC PEPTIDE: B NATRIURETIC PEPTIDE 5: 483 pg/mL — AB (ref 0.0–100.0)

## 2014-05-24 LAB — I-STAT TROPONIN, ED: TROPONIN I, POC: 0 ng/mL (ref 0.00–0.08)

## 2014-05-24 LAB — LIPASE, BLOOD: Lipase: 13 U/L — ABNORMAL LOW (ref 22–51)

## 2014-05-24 MED ORDER — IOHEXOL 350 MG/ML SOLN
100.0000 mL | Freq: Once | INTRAVENOUS | Status: AC | PRN
Start: 2014-05-24 — End: 2014-05-24
  Administered 2014-05-24: 100 mL via INTRAVENOUS

## 2014-05-24 MED ORDER — FUROSEMIDE 10 MG/ML IJ SOLN
40.0000 mg | Freq: Once | INTRAMUSCULAR | Status: AC
  Administered 2014-05-24: 40 mg via INTRAVENOUS
  Filled 2014-05-24: qty 4

## 2014-05-24 MED ORDER — GI COCKTAIL ~~LOC~~
30.0000 mL | Freq: Once | ORAL | Status: AC
  Administered 2014-05-24: 30 mL via ORAL
  Filled 2014-05-24: qty 30

## 2014-05-24 NOTE — ED Provider Notes (Signed)
CSN: [id number]     Arrival date & time 05/24/14  1723 History   First MD Initiated Contact with Patient 05/24/14 1929     Chief Complaint  Patient presents with  . Chest Pain  . Abdominal Pain  . Shortness of Breath     (Consider location/radiation/quality/duration/timing/severity/associated sxs/prior Treatment) HPI Comments: Greg Patrick is a 34 y.o. male with a PMHx of asthma and pancreatitis, who presents to the ED with complaints of intermittent chest tightness and shortness of breath, as well as intermittent abdominal pain 3 days. He states that he developed some mild sinus congestion and a dry cough, and mild chest tightness in the mornings accompanied by SOB, which he attributed to his environmental allergies and started taking Sudafed which helped his symptoms. He states that his chest tightness and shortness of breath are worse in the morning, and come intermittently throughout the day, denying that he has any symptoms currently. He denies that he has chest pain, only tightness. He describes his abdominal pain as 3/10 intermittent tightness in the epigastrum, nonradiating, worse with eating, and relieved with ibuprofen. He denies any fevers, chills, sore throat, ear pain or drainage, eye symptoms, wheezing, hemoptysis, diaphoresis, lightheadedness, jaw or neck pain, back pain, nausea, vomiting, diarrhea, constipation, melena, hematochezia, dysuria, hematuria, lower extremity swelling, recent travel/surgery/immobilization, history of DVT, numbness, tingling, weakness, headache, vision changes, claudication, PND, or orthopnea. He smokes occasionally. Denies any family history of cardiac disease. Denies any personal history of cardiac disease, hypertension, hyperlipidemia, or diabetes.  Patient is a 34 y.o. male presenting with abdominal pain and shortness of breath. The history is provided by the patient. No language interpreter was used.  Abdominal Pain Pain location:  Epigastric Pain  quality comment:  Tightness Pain radiates to:  Does not radiate Pain severity:  Mild Onset quality:  Gradual Duration:  3 days Timing:  Intermittent Progression:  Unchanged Chronicity:  New Context: recent illness (sinus congestion, cough)   Context: not recent travel   Relieved by:  NSAIDs Worsened by:  Eating Ineffective treatments:  None tried Associated symptoms: cough (dry) and shortness of breath (intermittent)   Associated symptoms: no chest pain, no chills, no constipation, no diarrhea, no dysuria, no fever, no hematemesis, no hematochezia, no hematuria, no melena, no nausea, no sore throat and no vomiting   Shortness of Breath Associated symptoms: abdominal pain (intermittent epigastric) and cough (dry)   Associated symptoms: no chest pain, no diaphoresis, no ear pain, no fever, no headaches, no neck pain, no rash, no sore throat, no vomiting and no wheezing     Past Medical History  Diagnosis Date  . Asthma   . Pancreatitis    History reviewed. No pertinent past surgical history. History reviewed. No pertinent family history. History  Substance Use Topics  . Smoking status: Current Every Day Smoker    Types: Cigarettes  . Smokeless tobacco: Not on file  . Alcohol Use: Yes    Review of Systems  Constitutional: Negative for fever, chills and diaphoresis.  HENT: Positive for sinus pressure. Negative for ear discharge, ear pain, rhinorrhea, sore throat and trouble swallowing.   Eyes: Negative for pain, discharge, itching and visual disturbance.  Respiratory: Positive for cough (dry), chest tightness (intermittent) and shortness of breath (intermittent). Negative for wheezing.   Cardiovascular: Negative for chest pain and leg swelling.  Gastrointestinal: Positive for abdominal pain (intermittent epigastric). Negative for nausea, vomiting, diarrhea, constipation, blood in stool, melena, hematochezia and hematemesis.  Genitourinary: Negative for dysuria and hematuria.  Musculoskeletal: Negative for myalgias, back pain, arthralgias and neck pain.  Skin: Negative for color change and rash.  Allergic/Immunologic: Negative for immunocompromised state.  Neurological: Negative for dizziness, weakness, light-headedness, numbness and headaches.  Psychiatric/Behavioral: Negative for confusion.   10 Systems reviewed and are negative for acute change except as noted in the HPI.    Allergies  Review of patient's allergies indicates no known allergies.  Home Medications   Prior to Admission medications   Medication Sig Start Date End Date Taking? Authorizing Provider  ibuprofen (ADVIL,MOTRIN) 200 MG tablet Take 600 mg by mouth every 6 (six) hours as needed for moderate pain.   Yes Historical Provider, MD  Nutritional Supplements (COLD AND FLU PO) Take 2 capsules by mouth every 6 (six) hours as needed (cold and flu symptoms).   Yes Historical Provider, MD  amoxicillin (AMOXIL) 500 MG capsule Take 2 capsules (1,000 mg total) by mouth 2 (two) times daily. Patient not taking: Reported on 05/24/2014 06/12/13   Gregor Hams, MD  ciprofloxacin-dexamethasone [facility]) otic suspension Place 4 drops into the right ear 2 (two) times daily. Patient not taking: Reported on 05/24/2014 06/15/13   Etta Quill, NP  HYDROcodone-acetaminophen (NORCO/VICODIN) 5-325 MG per tablet Take 1 tablet by mouth every 6 (six) hours as needed. Patient not taking: Reported on 05/24/2014 06/12/13   Gregor Hams, MD  ondansetron (ZOFRAN) 8 MG tablet Take 1 tablet (8 mg total) by mouth every 8 (eight) hours as needed for nausea or vomiting. Patient not taking: Reported on 05/24/2014 06/12/13   Gregor Hams, MD   BP 157/91 mmHg  Pulse 111  Temp(Src) 98.6 F (37 C) (Oral)  Resp 22  SpO2 96% Physical Exam  Constitutional: He is oriented to person, place, and time. Vital signs are normal. He appears well-developed and well-nourished.  Non-toxic appearance. No distress.  Afebrile, nontoxic, NAD  HENT:   Head: Normocephalic and atraumatic.  Nose: Mucosal edema present.  Mouth/Throat: Uvula is midline, oropharynx is clear and moist and mucous membranes are normal. No trismus in the jaw. No uvula swelling.  Slight nasal mucosal edema bilaterally  Eyes: Conjunctivae and EOM are normal. Right eye exhibits no discharge. Left eye exhibits no discharge.  Neck: Normal range of motion. Neck supple. JVD present. Thyromegaly present.  Slight JVD noted  Thyromegaly noted upon reassessment when pt was lying at an angle  Cardiovascular: Regular rhythm, normal heart sounds and intact distal pulses.  Tachycardia present.  Exam reveals no gallop and no friction rub.   No murmur heard. Tachycardic, reg rhythm, nl s1/s2, no m/r/g, distal pulses intact, 1-2+ b/l pitting edema   Pulmonary/Chest: Effort normal and breath sounds normal. No respiratory distress. He has no decreased breath sounds. He has no wheezes. He has no rhonchi. He has no rales. He exhibits no tenderness, no crepitus, no deformity and no retraction.  CTAB in all lung fields, no w/r/r, no hypoxia or increased WOB, speaking in full sentences, SpO2 96% on RA Chest wall nonTTP without crepitus or deformity  Abdominal: Soft. Normal appearance and bowel sounds are normal. He exhibits no distension and no pulsatile midline mass. There is tenderness in the epigastric area. There is no rigidity, no rebound, no guarding, no CVA tenderness, no tenderness at McBurney's point and negative Murphy's sign.    Soft, nondistended, with no midline pulsatile mass, +BS throughout, with very mild epigastric TTP, no r/g/r, neg murphy's, neg mcburney's, no CVA TTP   Musculoskeletal: Normal range of motion.  Right lower leg: He exhibits edema.       Left lower leg: He exhibits edema.  MAE x4 Strength and sensation grossly intact Distal pulses intact 1-2+ bilateral LE pitting edema, neg homan's bilaterally   Neurological: He is alert and oriented to person,  place, and time. He has normal strength. No sensory deficit.  Skin: Skin is warm, dry and intact. No rash noted.  No erythema or warmth to BLEs  Psychiatric: He has a normal mood and affect.  Nursing note and vitals reviewed.   ED Course  Procedures (including critical care time) Labs Review Labs Reviewed  CBC - Abnormal; Notable for the following:    Hemoglobin 12.8 (*)    All other components within normal limits  BRAIN NATRIURETIC PEPTIDE - Abnormal; Notable for the following:    B Natriuretic Peptide 483.0 (*)    All other components within normal limits  LIPASE, BLOOD - Abnormal; Notable for the following:    Lipase 13 (*)    All other components within normal limits  COMPREHENSIVE METABOLIC PANEL - Abnormal; Notable for the following:    Glucose, Bld 107 (*)    Alkaline Phosphatase 248 (*)    All other components within normal limits  D-DIMER, QUANTITATIVE - Abnormal; Notable for the following:    D-Dimer, Quant 0.62 (*)    All other components within normal limits  URINALYSIS, ROUTINE W REFLEX MICROSCOPIC  TSH  TROPONIN I  I-STAT TROPOININ, ED    Imaging Review Dg Chest 2 View (if Patient Has Fever And/or Copd)  05/24/2014   CLINICAL DATA:  Chest pain, shortness of breath, dry cough for 3 days. Smoker.  EXAM: CHEST  2 VIEW  COMPARISON:  07/16/2011  FINDINGS: Cardiomegaly. No overt edema. No effusions or focal airspace opacities. No acute bony abnormality.  IMPRESSION: Cardiomegaly.  No acute disease.   Electronically Signed   By: Rolm Baptise M.D.   On: 05/24/2014 18:22   Ct Angio Chest Pe W/cm &/or Wo Cm  05/24/2014   CLINICAL DATA:  Acute onset of intermittent generalized chest tightness and upper abdominal tightness. Shortness of breath. Initial encounter.  EXAM: CT ANGIOGRAPHY CHEST WITH CONTRAST  TECHNIQUE: Multidetector CT imaging of the chest was performed using the standard protocol during bolus administration of intravenous contrast. Multiplanar CT image  reconstructions and MIPs were obtained to evaluate the vascular anatomy.  CONTRAST:  167m OMNIPAQUE IOHEXOL 350 MG/ML SOLN  COMPARISON:  Chest radiograph from 05/24/2014  FINDINGS: There is no evidence of pulmonary embolus.  Minimal left basilar atelectasis or scarring is noted. A few tiny blebs are noted near the right lung base. The lungs are otherwise clear. There is no evidence of significant focal consolidation, pleural effusion or pneumothorax. No masses are identified; no abnormal focal contrast enhancement is seen.  A 8 mm right peribronchial node remains borderline normal in size. No hilar or mediastinal lymphadenopathy is seen. No pericardial effusion is identified. Residual thymic tissue is within normal limits. The great vessels are grossly unremarkable in appearance. No axillary lymphadenopathy is seen.  The thyroid gland is diffusely enlarged, likely reflecting thyroid goiter.  The visualized portions of the liver and spleen are unremarkable.  No acute osseous abnormalities are seen.  Review of the MIP images confirms the above findings.  IMPRESSION: 1. No evidence of pulmonary embolus. 2. Minimal left basilar atelectasis or scarring noted. Few tiny blebs near the right lung base. Lungs otherwise clear. 3. Diffusely enlarged thyroid gland likely reflects thyroid goiter.  Electronically Signed   By: Garald Balding M.D.   On: 05/24/2014 23:59     EKG Interpretation   Date/Time:  Friday May 24 2014 17:34:22 EDT Ventricular Rate:  109 PR Interval:  161 QRS Duration: 105 QT Interval:  383 QTC Calculation: 516 R Axis:   23 Text Interpretation:  Sinus tachycardia Biatrial enlargement Prolonged QT  interval No old tracing to compare Confirmed by GOLDSTON  MD, SCOTT ([id number])  on 05/24/2014 7:40:34 PM      MDM   Final diagnoses:  Chest tightness  SOB (shortness of breath)  Thyromegaly  Epigastric abdominal pain  Tachycardia    34 y.o. male here with intermittent chest tightness and  SOB worse in the morning, denies chest pain, x3 days. Also having upper abd "tightness" intermittently. Mild epigastric abd tenderness, nonperitoneal, with no midline pulsatile mass. Tachycardic and with BLE pitting edema, clear lung exam. No hypoxia or increased WOB but tachycardia concerning for PE, will get dimer, if positive will need CTA. Trop neg, CBC unremarkable, BNP 483, lipase WNL, CMP showing chronically elevated alk phos although slightly higher than normal. CXR showing cardiomegaly without pulmonary edema. EKG with sinus tachy and bilateral atrial enlargement. Will give lasix and reassess after dimer. Will give GI cocktail as well.  9:05 PM Dimer elevated at 0.62, will obtain CTA to r/o PE. Pt stating his abd pain is resolved after GI cocktail. Will monitor.   12:08 AM U/A clear. CTA showing diffusely enlarged thyroid, no PE. In this setting, given the tachycardia and periph edema, this could represent thyrotoxicosis/early thyroid storm. Will get TSH now, and consult for admission. Will also repeat troponin since it's been >3hrs since last. Upon reassessment, pt continues to be controlled with pain. Of note, now that pt is lying flat at a higher angle than during his previous exam and his thyroid is notably more prominent. When asked how long this has been present, he states it's been years and never caused him any issues.   1:11 AM Dr. Roel Cluck returning page, will admit pt. Please see her notes for further documentation of care.   BP 142/77 mmHg  Pulse 107  Temp(Src) 98.6 F (37 C) (Oral)  Resp 27  SpO2 97%  Meds ordered this encounter  Medications  . furosemide (LASIX) injection 40 mg    Sig:   . gi cocktail (Maalox,Lidocaine,Donnatal)    Sig:   . iohexol (OMNIPAQUE) 350 MG/ML injection 100 mL    Sig:      Athziry Millican Camprubi-Soms, PA-C 05/25/14 0111  Sherwood Gambler, MD 05/25/14 1934

## 2014-05-24 NOTE — ED Notes (Addendum)
Pt c/o intermittent generalized chest tightness, upper abdominal tightness, and SOB x 3 days.  Pain score 3/10.  Denies n/v/d.  Pt reports that he was sleeping when symptoms started.  Hx of asthma and smoking.  Sts "I have not used and inhaler since I was a kid."  Pt reports taking OTC decongestant.

## 2014-05-25 ENCOUNTER — Encounter (HOSPITAL_COMMUNITY): Payer: Self-pay | Admitting: Internal Medicine

## 2014-05-25 ENCOUNTER — Other Ambulatory Visit (HOSPITAL_COMMUNITY): Payer: Managed Care, Other (non HMO)

## 2014-05-25 ENCOUNTER — Inpatient Hospital Stay (HOSPITAL_COMMUNITY): Payer: PRIVATE HEALTH INSURANCE

## 2014-05-25 DIAGNOSIS — R079 Chest pain, unspecified: Secondary | ICD-10-CM | POA: Diagnosis present

## 2014-05-25 DIAGNOSIS — G737 Myopathy in diseases classified elsewhere: Secondary | ICD-10-CM | POA: Diagnosis present

## 2014-05-25 DIAGNOSIS — I43 Cardiomyopathy in diseases classified elsewhere: Secondary | ICD-10-CM | POA: Diagnosis present

## 2014-05-25 DIAGNOSIS — I517 Cardiomegaly: Secondary | ICD-10-CM | POA: Diagnosis present

## 2014-05-25 DIAGNOSIS — I509 Heart failure, unspecified: Secondary | ICD-10-CM | POA: Diagnosis not present

## 2014-05-25 DIAGNOSIS — E0500 Thyrotoxicosis with diffuse goiter without thyrotoxic crisis or storm: Secondary | ICD-10-CM | POA: Diagnosis present

## 2014-05-25 DIAGNOSIS — E010 Iodine-deficiency related diffuse (endemic) goiter: Secondary | ICD-10-CM | POA: Insufficient documentation

## 2014-05-25 DIAGNOSIS — Z791 Long term (current) use of non-steroidal anti-inflammatories (NSAID): Secondary | ICD-10-CM | POA: Diagnosis not present

## 2014-05-25 DIAGNOSIS — R9431 Abnormal electrocardiogram [ECG] [EKG]: Secondary | ICD-10-CM | POA: Diagnosis present

## 2014-05-25 DIAGNOSIS — E0590 Thyrotoxicosis, unspecified without thyrotoxic crisis or storm: Secondary | ICD-10-CM | POA: Diagnosis present

## 2014-05-25 DIAGNOSIS — R0789 Other chest pain: Secondary | ICD-10-CM | POA: Diagnosis present

## 2014-05-25 DIAGNOSIS — J45909 Unspecified asthma, uncomplicated: Secondary | ICD-10-CM | POA: Diagnosis present

## 2014-05-25 DIAGNOSIS — E049 Nontoxic goiter, unspecified: Secondary | ICD-10-CM | POA: Diagnosis present

## 2014-05-25 DIAGNOSIS — F17210 Nicotine dependence, cigarettes, uncomplicated: Secondary | ICD-10-CM | POA: Diagnosis present

## 2014-05-25 DIAGNOSIS — Z79899 Other long term (current) drug therapy: Secondary | ICD-10-CM | POA: Diagnosis not present

## 2014-05-25 DIAGNOSIS — I4581 Long QT syndrome: Secondary | ICD-10-CM | POA: Diagnosis present

## 2014-05-25 LAB — CBC WITH DIFFERENTIAL/PLATELET
Basophils Absolute: 0 10*3/uL (ref 0.0–0.1)
Basophils Relative: 0 % (ref 0–1)
EOS PCT: 5 % (ref 0–5)
Eosinophils Absolute: 0.2 10*3/uL (ref 0.0–0.7)
HCT: 40.6 % (ref 39.0–52.0)
Hemoglobin: 12.7 g/dL — ABNORMAL LOW (ref 13.0–17.0)
LYMPHS PCT: 28 % (ref 12–46)
Lymphs Abs: 1.4 10*3/uL (ref 0.7–4.0)
MCH: 25.9 pg — AB (ref 26.0–34.0)
MCHC: 31.3 g/dL (ref 30.0–36.0)
MCV: 82.9 fL (ref 78.0–100.0)
MONOS PCT: 18 % — AB (ref 3–12)
Monocytes Absolute: 0.9 10*3/uL (ref 0.1–1.0)
NEUTROS ABS: 2.4 10*3/uL (ref 1.7–7.7)
NEUTROS PCT: 49 % (ref 43–77)
PLATELETS: 195 10*3/uL (ref 150–400)
RBC: 4.9 MIL/uL (ref 4.22–5.81)
RDW: 14.5 % (ref 11.5–15.5)
WBC: 4.9 10*3/uL (ref 4.0–10.5)

## 2014-05-25 LAB — MAGNESIUM: Magnesium: 1.9 mg/dL (ref 1.7–2.4)

## 2014-05-25 LAB — URINALYSIS, ROUTINE W REFLEX MICROSCOPIC
GLUCOSE, UA: NEGATIVE mg/dL
Hgb urine dipstick: NEGATIVE
KETONES UR: NEGATIVE mg/dL
Leukocytes, UA: NEGATIVE
Nitrite: NEGATIVE
PH: 6.5 (ref 5.0–8.0)
Protein, ur: 100 mg/dL — AB
SPECIFIC GRAVITY, URINE: 1.036 — AB (ref 1.005–1.030)
Urobilinogen, UA: 2 mg/dL — ABNORMAL HIGH (ref 0.0–1.0)

## 2014-05-25 LAB — TROPONIN I
Troponin I: <0.03 ng/mL (ref <0.031)
Troponin I: <0.03 ng/mL (ref <0.031)
Troponin I: <0.03 ng/mL (ref <0.031)

## 2014-05-25 LAB — COMPREHENSIVE METABOLIC PANEL
ALK PHOS: 248 U/L — AB (ref 38–126)
ALT: 25 U/L (ref 17–63)
ANION GAP: 9 (ref 5–15)
AST: 27 U/L (ref 15–41)
Albumin: 3.5 g/dL (ref 3.5–5.0)
BILIRUBIN TOTAL: 1.1 mg/dL (ref 0.3–1.2)
BUN: 11 mg/dL (ref 6–20)
CHLORIDE: 103 mmol/L (ref 101–111)
CO2: 29 mmol/L (ref 22–32)
Calcium: 9.1 mg/dL (ref 8.9–10.3)
Creatinine, Ser: 0.58 mg/dL — ABNORMAL LOW (ref 0.61–1.24)
Glucose, Bld: 113 mg/dL — ABNORMAL HIGH (ref 65–99)
Potassium: 3.5 mmol/L (ref 3.5–5.1)
SODIUM: 141 mmol/L (ref 135–145)
Total Protein: 7 g/dL (ref 6.5–8.1)

## 2014-05-25 LAB — RAPID URINE DRUG SCREEN, HOSP PERFORMED
AMPHETAMINES: NOT DETECTED
Barbiturates: POSITIVE — AB
Benzodiazepines: NOT DETECTED
COCAINE: NOT DETECTED
OPIATES: NOT DETECTED
TETRAHYDROCANNABINOL: POSITIVE — AB

## 2014-05-25 LAB — T4, FREE

## 2014-05-25 LAB — URINE MICROSCOPIC-ADD ON

## 2014-05-25 LAB — HIV ANTIBODY (ROUTINE TESTING W REFLEX): HIV SCREEN 4TH GENERATION: NONREACTIVE

## 2014-05-25 LAB — PHOSPHORUS: Phosphorus: 4.4 mg/dL (ref 2.5–4.6)

## 2014-05-25 LAB — TSH: TSH: 0.01 u[IU]/mL — ABNORMAL LOW (ref 0.350–4.500)

## 2014-05-25 MED ORDER — ATENOLOL 25 MG PO TABS: 25.0000 mg | ORAL_TABLET | Freq: Every day | ORAL | Status: DC

## 2014-05-25 MED ORDER — ENOXAPARIN SODIUM 40 MG/0.4ML ~~LOC~~ SOLN
40.0000 mg | SUBCUTANEOUS | Status: DC
  Filled 2014-05-25: qty 0.4

## 2014-05-25 MED ORDER — ATENOLOL 50 MG PO TABS
50.0000 mg | ORAL_TABLET | Freq: Every day | ORAL | Status: DC
  Administered 2014-05-26: 50 mg via ORAL
  Filled 2014-05-25: qty 1

## 2014-05-25 MED ORDER — ACETAMINOPHEN 325 MG PO TABS: 650.0000 mg | ORAL_TABLET | Freq: Four times a day (QID) | ORAL | Status: DC | PRN

## 2014-05-25 MED ORDER — METOPROLOL TARTRATE 25 MG PO TABS
12.5000 mg | ORAL_TABLET | Freq: Two times a day (BID) | ORAL | Status: DC
  Administered 2014-05-25 (×2): 12.5 mg via ORAL
  Filled 2014-05-25 (×2): qty 1

## 2014-05-25 MED ORDER — ASPIRIN EC 81 MG PO TBEC
81.0000 mg | DELAYED_RELEASE_TABLET | Freq: Every day | ORAL | Status: DC
  Administered 2014-05-25 – 2014-05-26 (×2): 81 mg via ORAL
  Filled 2014-05-25 (×2): qty 1

## 2014-05-25 MED ORDER — HYDROCODONE-ACETAMINOPHEN 5-325 MG PO TABS: 1.0000 | ORAL_TABLET | ORAL | Status: DC | PRN

## 2014-05-25 MED ORDER — ATENOLOL 25 MG PO TABS
25.0000 mg | ORAL_TABLET | Freq: Once | ORAL | Status: AC
  Administered 2014-05-25: 25 mg via ORAL
  Filled 2014-05-25: qty 1

## 2014-05-25 MED ORDER — SODIUM CHLORIDE 0.9 % IV SOLN: 250.0000 mL | INTRAVENOUS | Status: DC | PRN

## 2014-05-25 MED ORDER — SODIUM CHLORIDE 0.9 % IJ SOLN: 3.0000 mL | Freq: Two times a day (BID) | INTRAMUSCULAR | Status: DC

## 2014-05-25 MED ORDER — SODIUM CHLORIDE 0.9 % IJ SOLN
3.0000 mL | Freq: Two times a day (BID) | INTRAMUSCULAR | Status: DC
  Administered 2014-05-25 (×2): 3 mL via INTRAVENOUS

## 2014-05-25 MED ORDER — ACETAMINOPHEN 650 MG RE SUPP: 650.0000 mg | Freq: Four times a day (QID) | RECTAL | Status: DC | PRN

## 2014-05-25 MED ORDER — SODIUM CHLORIDE 0.9 % IJ SOLN: 3.0000 mL | INTRAMUSCULAR | Status: DC | PRN

## 2014-05-25 NOTE — H&P (Signed)
PCP:  none   Referring provider MErcedes   Chief Complaint: Chest pain  HPI: Greg Patrick is a 34 y.o. male   has a past medical history of Asthma and Pancreatitis.   Presented with intermittent chest pain for past 3 days associated shortness of breath. Patient states this was worse in the morning. During evaluation patient was noted to have severe neck swelling which for now to be goiter. Patient was also noted to have low extremity edema and elevated BNP. He was given a dose of Lasix at that point his shortness of breath has resolved. Chest x-ray showed cardiomegaly. Troponin unremarkable. TSH 0.010 T4 and T3 levels have been ordered. Patient was noted to be tachycardic up to 105. In emergency. 1 he was given GI cocktail which helped a lot with chest pain.  Patient denies any palpitations states that the neck swelling has been going on for long time he is not sure when dated noticed he himself had not been noticed any leg swelling until it was pointed out to him.   Hospitalist was called for admission for thyrotoxicosis   Review of Systems:    Pertinent positives include:   chest pain, shortness of breath at rest.  Constitutional:  No weight loss, night sweats, Fevers, chills, fatigue, weight loss  HEENT:  No headaches, Difficulty swallowing,Tooth/dental problems,Sore throat,  No sneezing, itching, ear ache, nasal congestion, post nasal drip,  Cardio-vascular:  NoOrthopnea, PND, anasarca, dizziness, palpitations.no Bilateral lower extremity swelling  GI:  No heartburn, indigestion, abdominal pain, nausea, vomiting, diarrhea, change in bowel habits, loss of appetite, melena, blood in stool, hematemesis Resp:  no  No dyspnea on exertion, No excess mucus, no productive cough, No non-productive cough, No coughing up of blood.No change in color of mucus.No wheezing. Skin:  no rash or lesions. No jaundice GU:  no dysuria, change in color of urine, no urgency or frequency. No  straining to urinate.  No flank pain.  Musculoskeletal:  No joint pain or no joint swelling. No decreased range of motion. No back pain.  Psych:  No change in mood or affect. No depression or anxiety. No memory loss.  Neuro: no localizing neurological complaints, no tingling, no weakness, no double vision, no gait abnormality, no slurred speech, no confusion  Otherwise ROS are negative except for above, 10 systems were reviewed  Past Medical History: Past Medical History  Diagnosis Date  . Asthma   . Pancreatitis    History reviewed. No pertinent past surgical history.   Medications: Prior to Admission medications   Medication Sig Start Date End Date Taking? Authorizing Provider  ibuprofen (ADVIL,MOTRIN) 200 MG tablet Take 600 mg by mouth every 6 (six) hours as needed for moderate pain.   Yes Historical Provider, MD  Nutritional Supplements (COLD AND FLU PO) Take 2 capsules by mouth every 6 (six) hours as needed (cold and flu symptoms).   Yes Historical Provider, MD  amoxicillin (AMOXIL) 500 MG capsule Take 2 capsules (1,000 mg total) by mouth 2 (two) times daily. Patient not taking: Reported on 05/24/2014 06/12/13   Rodolph Bong, MD  ciprofloxacin-dexamethasone [facility]) otic suspension Place 4 drops into the right ear 2 (two) times daily. Patient not taking: Reported on 05/24/2014 06/15/13   Felicie Morn, NP  HYDROcodone-acetaminophen (NORCO/VICODIN) 5-325 MG per tablet Take 1 tablet by mouth every 6 (six) hours as needed. Patient not taking: Reported on 05/24/2014 06/12/13   Rodolph Bong, MD  ondansetron (ZOFRAN) 8 MG tablet Take 1  tablet (8 mg total) by mouth every 8 (eight) hours as needed for nausea or vomiting. Patient not taking: Reported on 05/24/2014 06/12/13   Rodolph BongEvan S Corey, MD    Allergies:  No Known Allergies  Social History:  Ambulatory   independently   Lives at home   With family fianc     reports that he has been smoking Cigarettes.  He does not have any smokeless  tobacco history on file. He reports that he drinks alcohol. He reports that he does not use illicit drugs.    Family History: family history includes Deep vein thrombosis in his mother.    Physical Exam: Patient Vitals for the past 24 hrs:  BP Temp Temp src Pulse Resp SpO2  05/25/14 0130 142/75 mmHg - - 105 (!) 30 97 %  05/24/14 2300 142/77 mmHg - - 107 (!) 27 97 %  05/24/14 2230 144/75 mmHg - - 109 (!) 31 99 %  05/24/14 2200 139/82 mmHg - - 107 (!) 29 98 %  05/24/14 2130 109/94 mmHg - - 114 (!) 28 99 %  05/24/14 2037 156/89 mmHg - - 112 (!) 29 100 %  05/24/14 1736 - - - - - 96 %  05/24/14 1735 157/91 mmHg 98.6 F (37 C) Oral 111 22 96 %    1. General:  in No Acute distress 2. Psychological: Alert and Oriented 3. Head/ENT:    Dry Mucous Membranes                          Head Non traumatic, neck supple                          Normal   Dentition                          Large goiter noted 4. SKIN: decreased Skin turgor,  Skin clean Dry and intact no rash, no nodularity noted over the forearms bilaterally probably lipomas she states that been there for 10 years or more 5. Heart:rapid  Regular rate and rhythm no Murmur, Rub or gallop 6. Lungs:  no wheezes mild crackles   7. Abdomen: Soft, non-tender, Non distended 8. Lower extremities: no clubbing, cyanosis, mild edema 9. Neurologically Grossly intact, moving all 4 extremities equally 10. MSK: Normal range of motion  body mass index is unknown because there is no weight on file.   Labs on Admission:   Results for orders placed or performed during the hospital encounter of 05/24/14 (from the past 24 hour(s))  CBC     Status: Abnormal   Collection Time: 05/24/14  5:58 PM  Result Value Ref Range   WBC 5.1 4.0 - 10.5 K/uL   RBC 4.84 4.22 - 5.81 MIL/uL   Hemoglobin 12.8 (L) 13.0 - 17.0 g/dL   HCT 16.140.4 09.639.0 - 04.552.0 %   MCV 83.5 78.0 - 100.0 fL   MCH 26.4 26.0 - 34.0 pg   MCHC 31.7 30.0 - 36.0 g/dL   RDW 40.914.7 81.111.5 - 91.415.5 %    Platelets 176 150 - 400 K/uL  BNP (order ONLY if patient complains of dyspnea/SOB AND you have documented it for THIS visit)     Status: Abnormal   Collection Time: 05/24/14  5:58 PM  Result Value Ref Range   B Natriuretic Peptide 483.0 (H) 0.0 - 100.0 pg/mL  Lipase, blood  Status: Abnormal   Collection Time: 05/24/14  5:58 PM  Result Value Ref Range   Lipase 13 (L) 22 - 51 U/L  Comprehensive metabolic panel     Status: Abnormal   Collection Time: 05/24/14  5:58 PM  Result Value Ref Range   Sodium 143 135 - 145 mmol/L   Potassium 3.8 3.5 - 5.1 mmol/L   Chloride 108 101 - 111 mmol/L   CO2 25 22 - 32 mmol/L   Glucose, Bld 107 (H) 65 - 99 mg/dL   BUN 12 6 - 20 mg/dL   Creatinine, Ser 9.60 0.61 - 1.24 mg/dL   Calcium 9.0 8.9 - 45.4 mg/dL   Total Protein 7.5 6.5 - 8.1 g/dL   Albumin 3.8 3.5 - 5.0 g/dL   AST 24 15 - 41 U/L   ALT 25 17 - 63 U/L   Alkaline Phosphatase 248 (H) 38 - 126 U/L   Total Bilirubin 0.9 0.3 - 1.2 mg/dL   GFR calc non Af Amer >60 >60 mL/min   GFR calc Af Amer >60 >60 mL/min   Anion gap 10 5 - 15  D-dimer, quantitative     Status: Abnormal   Collection Time: 05/24/14  5:58 PM  Result Value Ref Range   D-Dimer, Quant 0.62 (H) 0.00 - 0.48 ug/mL-FEU  I-stat troponin, ED  (not at [facility], [facility])     Status: None   Collection Time: 05/24/14  6:02 PM  Result Value Ref Range   Troponin i, poc 0.00 0.00 - 0.08 ng/mL   Comment 3          Urinalysis, Routine w reflex microscopic     Status: None   Collection Time: 05/24/14  9:19 PM  Result Value Ref Range   Color, Urine YELLOW YELLOW   APPearance CLEAR CLEAR   Specific Gravity, Urine 1.009 1.005 - 1.030   pH 7.0 5.0 - 8.0   Glucose, UA NEGATIVE NEGATIVE mg/dL   Hgb urine dipstick NEGATIVE NEGATIVE   Bilirubin Urine NEGATIVE NEGATIVE   Ketones, ur NEGATIVE NEGATIVE mg/dL   Protein, ur NEGATIVE NEGATIVE mg/dL   Urobilinogen, UA 1.0 0.0 - 1.0 mg/dL   Nitrite NEGATIVE NEGATIVE   Leukocytes, UA NEGATIVE NEGATIVE  TSH      Status: Abnormal   Collection Time: 05/25/14 12:25 AM  Result Value Ref Range   TSH 0.010 (L) 0.350 - 4.500 uIU/mL  Troponin I     Status: None   Collection Time: 05/25/14 12:25 AM  Result Value Ref Range   Troponin I <0.03 <0.031 ng/mL    UA no evidence of UTI  No results found for: HGBA1C  CrCl cannot be calculated (Unknown ideal weight.).  BNP (last 3 results) No results for input(s): PROBNP in the last 8760 hours.  Other results:  I have pearsonaly reviewed this: ECG REPORT  Rate: 104  Rhythm: Sinus tachycardia ST&T Change: No evidence of ischemia QTC prolonged 514   There were no vitals filed for this visit.   Cultures:    Component Value Date/Time   SDES THROAT 06/12/2013 1224   SPECREQUEST NONE 06/12/2013 1224   CULT  06/12/2013 1224    STREPTOCOCCUS,BETA HEMOLYTIC NOT GROUP A Performed at [facility]   REPTSTATUS 06/14/2013 FINAL 06/12/2013 1224     Radiological Exams on Admission: Dg Chest 2 View (if Patient Has Fever And/or Copd)  05/24/2014   CLINICAL DATA:  Chest pain, shortness of breath, dry cough for 3 days. Smoker.  EXAM: CHEST  2 VIEW  COMPARISON:  07/16/2011  FINDINGS: Cardiomegaly. No overt edema. No effusions or focal airspace opacities. No acute bony abnormality.  IMPRESSION: Cardiomegaly.  No acute disease.   Electronically Signed   By: Charlett Nose M.D.   On: 05/24/2014 18:22   Ct Angio Chest Pe W/cm &/or Wo Cm  05/24/2014   CLINICAL DATA:  Acute onset of intermittent generalized chest tightness and upper abdominal tightness. Shortness of breath. Initial encounter.  EXAM: CT ANGIOGRAPHY CHEST WITH CONTRAST  TECHNIQUE: Multidetector CT imaging of the chest was performed using the standard protocol during bolus administration of intravenous contrast. Multiplanar CT image reconstructions and MIPs were obtained to evaluate the vascular anatomy.  CONTRAST:  OMNIPAQUE IOHEXOL 350 MG/ML SOLN  COMPARISON:  Chest radiograph from  05/24/2014  FINDINGS: There is no evidence of pulmonary embolus.  Minimal left basilar atelectasis or scarring is noted. A few tiny blebs are noted near the right lung base. The lungs are otherwise clear. There is no evidence of significant focal consolidation, pleural effusion or pneumothorax. No masses are identified; no abnormal focal contrast enhancement is seen.  A 8 mm right peribronchial node remains borderline normal in size. No hilar or mediastinal lymphadenopathy is seen. No pericardial effusion is identified. Residual thymic tissue is within normal limits. The great vessels are grossly unremarkable in appearance. No axillary lymphadenopathy is seen.  The thyroid gland is diffusely enlarged, likely reflecting thyroid goiter.  The visualized portions of the liver and spleen are unremarkable.  No acute osseous abnormalities are seen.  Review of the MIP images confirms the above findings.  IMPRESSION: 1. No evidence of pulmonary embolus. 2. Minimal left basilar atelectasis or scarring noted. Few tiny blebs near the right lung base. Lungs otherwise clear. 3. Diffusely enlarged thyroid gland likely reflects thyroid goiter.   Electronically Signed   By: Roanna Raider M.D.   On: 05/24/2014 23:59    Chart has been reviewed  Family not  at  Bedside    Assessment/Plan  34 year old gentleman presents with chest pain was found to have goiter and evidence of thyrotoxicosis and cardiomegaly  Present on Admission:  . Chest pain - we'll admit and monitor T related to thyroid toxicosis. Also evidence of cardiomegaly worrisome for heart failure. We'll monitor on telemetry cycle cardiac enzymes  . Goiter - will evaluate for Graves' disease. Obtain thyroid antibodies and thyroid ultrasound. Would likely benefit from radioactive iodine uptake study    . Cardiomegaly obtain echogram, will need CHF workup. If evidence of heart failure. Will obtain HIV serology  . Thyrotoxicosis treat symptomatically for now  with metoprolol evaluate for the cause and treated appropriately  . QT prolongation repeat EKG in the morning. Obtain echogram monitor on telemetry   Prophylaxis:   Lovenox   CODE STATUS:  FULL CODE    Disposition:  To home once workup is complete and patient is stable  Other plan as per orders.  I have spent a total of 55 min on this admission  Greg Patrick 05/25/2014, 2:00 AM  [facility]  Pager [phone]   after 2 AM please page floor coverage PA If 7AM-7PM, please contact the day team taking care of the patient  [email or web address]  Password [email or web address]

## 2014-05-25 NOTE — ED Notes (Signed)
Main lab will add on T3, T4 blood work.

## 2014-05-25 NOTE — Progress Notes (Signed)
  Echocardiogram 2D Echocardiogram has been performed.  Greg EvertsRix, [name] A 05/25/2014, 10:26 AM

## 2014-05-25 NOTE — Progress Notes (Signed)
[facility] PROGRESS NOTE  Greg Patrick ZOX:[id number] DOB: [birth year] DOA: 05/24/2014 PCP: No PCP Per Patient   Brief narrative 34 year old African-American male with history of asthma, pancreatitis, who presented with intermittent chest pain for past 3 days associated with shortness of breath. He also reports off and on tremors, sweating and periodic leg swelling  For past 2-3 years.  Patient found to be tachycardic with a significant neck swelling. Chest x-ray showed cardiomegaly. TSH was moderately suppressed with elevated free T4. Patient underwent CT angiogram of the chest which was negative for PE. Admitted for hyperthyroidism/ thyrotoxicosis.   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 Follow thyroid antibodies and thyroid stimulating immunoglobulins. Since pt received IV contrast for chest CT in ED, cannot receive radioiodine thyroid uptake study until at least 4 weeks. Will need further w/up as outpt -Patient still tachycardic. Placed on atenolol 50 mg daily. -Ultrasound of the neck shows diffusely enlarged heterogeneous and hypervascular thyroid gland. -Follow 2-D echo results. utox positive for marijuana.  -Continue telemetry monitoring for 24 hours. -If clinically stable for discharge home tomorrow and arrange outpatient endocrinology follow-up.  Patient counseled on quitting marijuana  Code Status: Full code Family Communication:  None at bedside Disposition Plan: home tomorrow if stable overnigiht   Consultants:  none  Procedures:  CT angiogram chest  [place] beck   2d echo  Antibiotics:  none  HPI/Subjective: Admission H&P reviewed. Patient denies further chest pain or shortness of breath.  Objective: Filed Vitals:   05/25/14 1045  BP: 148/84  Pulse: 96  Temp: 98 F (36.7 C)  Resp: 18    Intake/Output Summary (Last 24 hours) at 05/25/14 1342 Last data  filed at 05/25/14 0835  Gross per 24 hour  Intake    600 ml  Output   1500 ml  Net   -900 ml   Filed Weights   05/25/14 0307  Weight: 67.314 kg (148 lb 6.4 oz)    Exam:   General:  Young thin built male in no acute distress  HEENT: No pallor, no proptosis, moist mucosa, large thyroid gland with smooth surface  Cardiovascular: S1 &S2 tachycardic, no murmurs rub or gallop  Respiratory: Clear bilaterally  Abdomen: Soft, nondistended, nontender, bowel sounds present  Musculoskeletal: Warm, no edema  CNS: Alert and oriented, no tremors   Data Reviewed: Basic Metabolic Panel:  Recent Labs Lab 05/24/14 1758 05/25/14 0551  NA 143 141  K 3.8 3.5  CL 108 103  CO2 25 29  GLUCOSE 107* 113*  BUN 12 11  CREATININE 0.61 0.58*  CALCIUM 9.0 9.1  MG  --  1.9  PHOS  --  4.4   Liver Function Tests:  Recent Labs Lab 05/24/14 1758 05/25/14 0551  AST 24 27  ALT 25 25  ALKPHOS 248* 248*  BILITOT 0.9 1.1  PROT 7.5 7.0  ALBUMIN 3.8 3.5    Recent Labs Lab 05/24/14 1758  LIPASE 13*   No results for input(s): AMMONIA in the last 168 hours. CBC:  Recent Labs Lab 05/24/14 1758 05/25/14 0551  WBC 5.1 4.9  NEUTROABS  --  2.4  HGB 12.8* 12.7*  HCT 40.4 40.6  MCV 83.5 82.9  PLT 176 195   Cardiac Enzymes:  Recent Labs Lab 05/25/14 0025 05/25/14 0551  TROPONINI <0.03 <0.03   BNP (last 3 results)  Recent Labs  05/24/14 1758  BNP 483.0*    ProBNP (last 3 results) No results for  input(s): PROBNP in the last 8760 hours.  CBG: No results for input(s): GLUCAP in the last 168 hours.  No results found for this or any previous visit (from the past 240 hour(s)).   Studies: Dg Chest 2 View (if Patient Has Fever And/or Copd)  05/24/2014   CLINICAL DATA:  Chest pain, shortness of breath, dry cough for 3 days. Smoker.  EXAM: CHEST  2 VIEW  COMPARISON:  07/16/2011  FINDINGS: Cardiomegaly. No overt edema. No effusions or focal airspace opacities. No acute bony  abnormality.  IMPRESSION: Cardiomegaly.  No acute disease.   Electronically Signed   By: Charlett NoseKevin  Dover M.D.   On: 05/24/2014 18:22   Ct Angio Chest Pe W/cm &/or Wo Cm  05/24/2014   CLINICAL DATA:  Acute onset of intermittent generalized chest tightness and upper abdominal tightness. Shortness of breath. Initial encounter.  EXAM: CT ANGIOGRAPHY CHEST WITH CONTRAST  TECHNIQUE: Multidetector CT imaging of the chest was performed using the standard protocol during bolus administration of intravenous contrast. Multiplanar CT image reconstructions and MIPs were obtained to evaluate the vascular anatomy.  CONTRAST:  100mL OMNIPAQUE IOHEXOL 350 MG/ML SOLN  COMPARISON:  Chest radiograph from 05/24/2014  FINDINGS: There is no evidence of pulmonary embolus.  Minimal left basilar atelectasis or scarring is noted. A few tiny blebs are noted near the right lung base. The lungs are otherwise clear. There is no evidence of significant focal consolidation, pleural effusion or pneumothorax. No masses are identified; no abnormal focal contrast enhancement is seen.  A 8 mm right peribronchial node remains borderline normal in size. No hilar or mediastinal lymphadenopathy is seen. No pericardial effusion is identified. Residual thymic tissue is within normal limits. The great vessels are grossly unremarkable in appearance. No axillary lymphadenopathy is seen.  The thyroid gland is diffusely enlarged, likely reflecting thyroid goiter.  The visualized portions of the liver and spleen are unremarkable.  No acute osseous abnormalities are seen.  Review of the MIP images confirms the above findings.  IMPRESSION: 1. No evidence of pulmonary embolus. 2. Minimal left basilar atelectasis or scarring noted. Few tiny blebs near the right lung base. Lungs otherwise clear. 3. Diffusely enlarged thyroid gland likely reflects thyroid goiter.   Electronically Signed   By: Roanna RaiderJeffery  Chang M.D.   On: 05/24/2014 23:59   [place] Soft Tissue  Head/neck  05/25/2014   CLINICAL DATA:  34 year-old male with goiter  EXAM: THYROID ULTRASOUND  TECHNIQUE: Ultrasound examination of the thyroid gland and adjacent soft tissues was performed.  COMPARISON:  None.  FINDINGS: Right thyroid lobe  Measurements: 9.8 x 3.4 x 3.5 cm. No nodules visualized. Diffusely heterogeneous and enlarged thyroid gland. The parenchyma is hypervascular.  Left thyroid lobe  Measurements: 6.8 x 3.8 x 3.6 cm. No nodules visualized. Diffusely heterogeneous and enlarged thyroid gland. The parenchyma is hypervascular.  Isthmus  Thickness:  1.6 cm.  No nodules visualized.  Lymphadenopathy  None visualized.  IMPRESSION: Diffusely enlarged, heterogeneous and hypervascular thyroid gland. If the patient is clinically hyperthyroid, Graves disease and acute thyroiditis can have a similar appearance.  No discrete thyroid nodule.   Electronically Signed   By: Malachy MoanHeath  McCullough M.D.   On: 05/25/2014 09:32    Scheduled Meds: . aspirin EC  81 mg Oral Daily  . atenolol  25 mg Oral Daily  . enoxaparin (LOVENOX) injection  40 mg Subcutaneous Q24H  . sodium chloride  3 mL Intravenous Q12H  . sodium chloride  3 mL Intravenous Q12H  Continuous Infusions:     Time spent: 25 minutes    Nury Nebergall  [facility] Pager [phone]. If 7PM-7AM, please contact night-coverage at [email or web address], password [facility] 05/25/2014, 1:42 PM  LOS: 0 days

## 2014-05-26 DIAGNOSIS — I429 Cardiomyopathy, unspecified: Secondary | ICD-10-CM

## 2014-05-26 DIAGNOSIS — E0590 Thyrotoxicosis, unspecified without thyrotoxic crisis or storm: Secondary | ICD-10-CM | POA: Diagnosis present

## 2014-05-26 MED ORDER — ATENOLOL 50 MG PO TABS: 50.0000 mg | ORAL_TABLET | Freq: Every day | ORAL | Status: DC

## 2014-05-26 MED ORDER — METHIMAZOLE 10 MG PO TABS
10.0000 mg | ORAL_TABLET | Freq: Two times a day (BID) | ORAL | Status: DC
  Administered 2014-05-26: 10 mg via ORAL
  Filled 2014-05-26 (×2): qty 1

## 2014-05-26 MED ORDER — ASPIRIN EC 81 MG PO TBEC: 81.0000 mg | DELAYED_RELEASE_TABLET | Freq: Every day | ORAL | Status: AC

## 2014-05-26 MED ORDER — LISINOPRIL 2.5 MG PO TABS: 2.5000 mg | ORAL_TABLET | Freq: Every day | ORAL | Status: DC

## 2014-05-26 MED ORDER — METHIMAZOLE 10 MG PO TABS: 10.0000 mg | ORAL_TABLET | Freq: Two times a day (BID) | ORAL | Status: DC

## 2014-05-26 MED ORDER — METHIMAZOLE 10 MG PO TABS
10.0000 mg | ORAL_TABLET | Freq: Every day | ORAL | Status: DC
  Filled 2014-05-26: qty 1

## 2014-05-26 NOTE — Discharge Instructions (Signed)
Hyperthyroidism  The thyroid is a large gland located in the lower front part of your neck. The thyroid helps control metabolism. Metabolism is how your body uses food. It controls metabolism with the hormone thyroxine. When the thyroid is overactive, it produces too much hormone. When this happens, these following problems may occur:   · Nervousness  · Heat intolerance  · Weight loss (in spite of increase food intake)  · Diarrhea  · Change in hair or skin texture  · Palpitations (heart skipping or having extra beats)  · Tachycardia (rapid heart rate)  · Loss of menstruation (amenorrhea)  · Shaking of the hands  CAUSES  · Grave's Disease (the immune system attacks the thyroid gland). This is the most common cause.  · Inflammation of the thyroid gland.  · Tumor (usually benign) in the thyroid gland or elsewhere.  · Excessive use of thyroid medications (both prescription and 'natural').  · Excessive ingestion of Iodine.  DIAGNOSIS   To prove hyperthyroidism, your caregiver may do blood tests and ultrasound tests. Sometimes the signs are hidden. It may be necessary for your caregiver to watch this illness with blood tests, either before or after diagnosis and treatment.  TREATMENT  Short-term treatment  There are several treatments to control symptoms. Drugs called beta blockers may give some relief. Drugs that decrease hormone production will provide temporary relief in many people. These measures will usually not give permanent relief.  Definitive therapy  There are treatments available which can be discussed between you and your caregiver which will permanently treat the problem. These treatments range from surgery (removal of the thyroid), to the use of radioactive iodine (destroys the thyroid by radiation), to the use of antithyroid drugs (interfere with hormone synthesis). The first two treatments are permanent and usually successful. They most often require hormone replacement therapy for life. This is because  it is impossible to remove or destroy the exact amount of thyroid required to make a person euthyroid (normal).  HOME CARE INSTRUCTIONS   See your caregiver if the problems you are being treated for get worse. Examples of this would be the problems listed above.  SEEK MEDICAL CARE IF:  Your general condition worsens.  MAKE SURE YOU:   · Understand these instructions.  · Will watch your condition.  · Will get help right away if you are not doing well or get worse.  Document Released: 12/21/2004 Document Revised: 03/15/2011 Document Reviewed: 05/04/2006  [employer]® Patient Information ©2015 [employer]. This information is not intended to replace advice given to you by your health care provider. Make sure you discuss any questions you have with your health care provider.

## 2014-05-26 NOTE — Discharge Summary (Signed)
Physician Discharge Summary  Greg PonsMichael Patrick WGN:[id number] DOB: 08/01/1980 DOA: 05/24/2014  PCP: No PCP Per Patient  Admit date: 05/24/2014 Discharge date: 05/26/2014  Time spent: 30 minutes  Recommendations for Outpatient Follow-up:  1. Discharge home. Will arrange outpatient endocrinology follow-up on 5/23. Needs follow-up on free T4, thyroid  antibody and thyroid stimulating immunoglobulin results during outpatient follow-up.  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 He also reports off and on tremors, sweating and periodic leg swelling For past 2-3 years.  Patient found to be tachycardic with a significant neck swelling. Chest x-ray showed cardiomegaly. TSH was moderately suppressed with elevated free T4. Patient underwent CT angiogram of the chest which was negative for PE. Admitted for hyperthyroidism/ thyrotoxicosis.  Hospital Course:  Hyperthyroidism with thyrotoxicosis Chest pain and shortness of breath improved with IV Lasix and metoprolol on admission. --Ultrasound of the neck shows diffusely enlarged heterogeneous and hypervascular thyroid gland.. Suppressed TSH (< 0.01 and elevated free T4 of >5.5). High suspicion of Graves' disease. Follow thyroid antibodies and thyroid stimulating immunoglobulins. Since pt received IV contrast for chest CT in ED, cannot receive radioiodine thyroid uptake  study until at least 4-8  weeks.  -Patient stable on telemetry. -I spoke with endocrinologist Dr. Elvera LennoxGherghe on the phone briefly, low-grade with starting patient on methimazole 10 mg twice a day and arrange for outpatient endocrinology follow-up. Will try to call the endocrinology office tomorrow to establish outpatient care.  Secondary to myopathy due to thyrotoxicosis Cardiomegaly noted on chest x-ray with LVH on EKG. 2-D echo done showed EF of 40% with moderate global hypokinesis and grade 2 diastolic dysfunction. This is likely due to hyperthyroid cardiomyopathy. Continue atenolol. Patient asymptomatic without any signs of volume overload. Will add baby aspirin and low-dose lisinopril. Should follow-up with outpatient endocrinology.  Prolonged QTC QTC of 516. LVH on EKG. Lites are normal. Follow-up as outpatient.   Patient counseled on quitting marijuana.  Code Status: Full code Family Communication: Fianc at bedside Disposition Plan: home with outpatient endocrinology appointment.  Procedures:  CT angiogram of the chest  2-D echo  Ultrasound of the neck  Consultations:  None  Discharge Exam: Filed Vitals:   05/26/14 1033  BP: 133/73  Pulse: 100  Temp: 98 F (36.7 C)  Resp: 18    General: Young thin built male in no acute distress HEENT: No pallor, moist oral mucosa, no JVD, no proptosis, diffusely enlarged thyroid gland on palpation. Cardiovascular: Loud S1, normal S2, regular rate and rhythm, no murmurs rub or gallop Respiratory: Clear to auscultation bilaterally, no added sounds GI: Soft, nondistended, nontender, bowel sounds present Musculoskeletal: Warm, no edema neck/CNS: Alert and oriented  Discharge Instructions    Current Discharge Medication List    START taking these medications   Details  aspirin EC 81 MG tablet Take 1 tablet (81 mg total) by mouth daily. Qty: 30 tablet, Refills: 0    atenolol (TENORMIN) 50  MG tablet Take 1 tablet (50 mg  total) by mouth daily. Qty: 30 tablet, Refills: 0    lisinopril (ZESTRIL) 2.5 MG tablet Take 1 tablet (2.5 mg total) by mouth daily. Qty: 30 tablet, Refills: 0    methimazole (TAPAZOLE) 10 MG tablet Take 1 tablet (10 mg total) by mouth 2 (two) times daily. Qty: 60 tablet, Refills: 0      CONTINUE these medications which have NOT CHANGED   Details  Nutritional Supplements (COLD AND FLU PO) Take 2 capsules by mouth every 6 (six) hours as needed (cold and flu symptoms).      STOP taking these medications     ibuprofen (ADVIL,MOTRIN) 200 MG tablet      amoxicillin (AMOXIL) 500 MG capsule      ciprofloxacin-dexamethasone (CIPRODEX) otic suspension      HYDROcodone-acetaminophen (NORCO/VICODIN) 5-325 MG per tablet      ondansetron (ZOFRAN) 8 MG tablet        No Known Allergies Follow-up Information    Please follow up.   Why:  Will call to arrange for endocrinology appt on 5/23       The results of significant diagnostics from this hospitalization (including imaging, microbiology, ancillary and laboratory) are listed below for reference.    Significant Diagnostic Studies: Dg Chest 2 View (if Patient Has Fever And/or Copd)  05/24/2014   CLINICAL DATA:  Chest pain, shortness of breath, dry cough for 3 days. Smoker.  EXAM: CHEST  2 VIEW  COMPARISON:  07/16/2011  FINDINGS: Cardiomegaly. No overt edema. No effusions or focal airspace opacities. No acute bony abnormality.  IMPRESSION: Cardiomegaly.  No acute disease.   Electronically Signed   By: Charlett Nose M.D.   On: 05/24/2014 18:22   Ct Angio Chest Pe W/cm &/or Wo Cm  05/24/2014   CLINICAL DATA:  Acute onset of intermittent generalized chest tightness and upper abdominal tightness. Shortness of breath. Initial encounter.  EXAM: CT ANGIOGRAPHY CHEST WITH CONTRAST  TECHNIQUE: Multidetector CT imaging of the chest was performed using the standard protocol during bolus administration of intravenous contrast. Multiplanar CT image  reconstructions and MIPs were obtained to evaluate the vascular anatomy.  CONTRAST:  OMNIPAQUE IOHEXOL 350 MG/ML SOLN  COMPARISON:  Chest radiograph from 05/24/2014  FINDINGS: There is no evidence of pulmonary embolus.  Minimal left basilar atelectasis or scarring is noted. A few tiny blebs are noted near the right lung base. The lungs are otherwise clear. There is no evidence of significant focal consolidation, pleural effusion or pneumothorax. No masses are identified; no abnormal focal contrast enhancement is seen.  A 8 mm right peribronchial node remains borderline normal in size. No hilar or mediastinal lymphadenopathy is seen. No pericardial effusion is identified. Residual thymic tissue is within normal limits. The great vessels are grossly unremarkable in appearance. No axillary lymphadenopathy is seen.  The thyroid gland is diffusely enlarged, likely reflecting thyroid goiter.  The visualized portions of the liver and spleen are unremarkable.  No acute osseous abnormalities are seen.  Review of the MIP images confirms the above findings.  IMPRESSION: 1. No evidence of pulmonary embolus. 2. Minimal left basilar atelectasis or scarring noted. Few tiny blebs near the right lung base. Lungs otherwise clear. 3. Diffusely enlarged thyroid gland likely reflects thyroid goiter.   Electronically Signed   By: Roanna Raider M.D.   On: 05/24/2014 23:59   US Soft Tissue Head/neck  05/25/2014   CLINICAL DATA:  34 year-old male with goiter  EXAM:  THYROID ULTRASOUND  TECHNIQUE: Ultrasound examination of the thyroid gland and adjacent soft tissues was performed.  COMPARISON:  None.  FINDINGS: Right thyroid lobe  Measurements: 9.8 x 3.4 x 3.5 cm. No nodules visualized. Diffusely heterogeneous and enlarged thyroid gland. The parenchyma is hypervascular.  Left thyroid lobe  Measurements: 6.8 x 3.8 x 3.6 cm. No nodules visualized. Diffusely heterogeneous and enlarged thyroid gland. The parenchyma is hypervascular.   Isthmus  Thickness:  1.6 cm.  No nodules visualized.  Lymphadenopathy  None visualized.  IMPRESSION: Diffusely enlarged, heterogeneous and hypervascular thyroid gland. If the patient is clinically hyperthyroid, Graves disease and acute thyroiditis can have a similar appearance.  No discrete thyroid nodule.   Electronically Signed   By: Malachy Moan M.D.   On: 05/25/2014 09:32    Microbiology: No results found for this or any previous visit (from the past 240 hour(s)).   Labs: Basic Metabolic Panel:  Recent Labs Lab 05/24/14 1758 05/25/14 0551  NA 143 141  K 3.8 3.5  CL 108 103  CO2 25 29  GLUCOSE 107* 113*  BUN 12 11  CREATININE 0.61 0.58*  CALCIUM 9.0 9.1  MG  --  1.9  PHOS  --  4.4   Liver Function Tests:  Recent Labs Lab 05/24/14 1758 05/25/14 0551  AST 24 27  ALT 25 25  ALKPHOS 248* 248*  BILITOT 0.9 1.1  PROT 7.5 7.0  ALBUMIN 3.8 3.5    Recent Labs Lab 05/24/14 1758  LIPASE 13*   No results for input(s): AMMONIA in the last 168 hours. CBC:  Recent Labs Lab 05/24/14 1758 05/25/14 0551  WBC 5.1 4.9  NEUTROABS  --  2.4  HGB 12.8* 12.7*  HCT 40.4 40.6  MCV 83.5 82.9  PLT 176 195   Cardiac Enzymes:  Recent Labs Lab 05/25/14 0025 05/25/14 0551 05/25/14 1306 05/25/14 1840  TROPONINI <0.03 <0.03 <0.03 <0.03   BNP: BNP (last 3 results)  Recent Labs  05/24/14 1758  BNP 483.0*    ProBNP (last 3 results) No results for input(s): PROBNP in the last 8760 hours.  CBG: No results for input(s): GLUCAP in the last 168 hours.     SignedEddie North  [facility] 05/26/2014, 10:45 AM

## 2014-05-27 LAB — HEMOGLOBIN A1C
Hgb A1c MFr Bld: 5.7 % — ABNORMAL HIGH (ref 4.8–5.6)
MEAN PLASMA GLUCOSE: 117 mg/dL

## 2014-05-27 LAB — T3: T3, Total: >651 ng/dL — ABNORMAL HIGH (ref 71–180)

## 2014-05-27 LAB — THYROID ANTIBODIES
THYROGLOBULIN ANTIBODY: 5.6 [IU]/mL — AB (ref 0.0–0.9)
Thyroperoxidase Ab SerPl-aCnc: >600 IU/mL — ABNORMAL HIGH (ref 0–34)

## 2014-06-02 LAB — THYROID STIMULATING IMMUNOGLOBULIN: TSI: 496 % baseline — ABNORMAL HIGH (ref <140)

## 2014-06-05 ENCOUNTER — Ambulatory Visit (INDEPENDENT_AMBULATORY_CARE_PROVIDER_SITE_OTHER): Payer: PRIVATE HEALTH INSURANCE | Admitting: Family Medicine

## 2014-06-05 VITALS — BP 140/60 | HR 97 | Temp 98.3°F | Resp 16 | Ht 70.0 in | Wt 160.1 lb

## 2014-06-05 DIAGNOSIS — E049 Nontoxic goiter, unspecified: Secondary | ICD-10-CM | POA: Diagnosis not present

## 2014-06-05 DIAGNOSIS — E0590 Thyrotoxicosis, unspecified without thyrotoxic crisis or storm: Secondary | ICD-10-CM | POA: Diagnosis not present

## 2014-06-05 DIAGNOSIS — J0100 Acute maxillary sinusitis, unspecified: Secondary | ICD-10-CM

## 2014-06-05 MED ORDER — AMOXICILLIN 500 MG PO CAPS: 500.0000 mg | ORAL_CAPSULE | Freq: Three times a day (TID) | ORAL | Status: DC

## 2014-06-05 NOTE — Patient Instructions (Signed)

## 2014-06-05 NOTE — Progress Notes (Signed)
Chief Complaint:  Chief Complaint  Patient presents with  . Facial Pain    Radiates to gums & brain  . Nasal Congestion    Pain with blowing nose    HPI: Greg Patrick is a 34 y.o. male who is here for  facial pain and nasal congestion for the last 4-5 days. He has pain radiating to his condoms and to his head. He has a headache. He has fullness in his ears. Couldn't sleep last night. He has tried over-the-counter medications without relief. He denies any chest pain shortness of breath or wheezing.  He was recently seen in the ER for hyperthyroid disease secondary to goiter. He was started on methimazole and has a endocrinology appointment next week.  Has had this large goiter for about a year.  BP Readings from Last 3 Encounters:  06/05/14 140/60  05/26/14 133/73  06/15/13 132/84     Past Medical History  Diagnosis Date  . Asthma   . Pancreatitis   . Thyroid disease     Hyperthyroid secondary to goiter   History reviewed. No pertinent past surgical history. History   Social History  . Marital Status: Single    Spouse Name: N/A  . Number of Children: N/A  . Years of Education: N/A   Social History Main Topics  . Smoking status: Current Every Day Smoker    Types: Cigarettes  . Smokeless tobacco: Never Used  . Alcohol Use: 0.0 oz/week    0 Standard drinks or equivalent per week     Comment: Socially  . Drug Use: No  . Sexual Activity: Not on file   Other Topics Concern  . None   Social History Narrative   Family History  Problem Relation Age of Onset  . Deep vein thrombosis Mother    No Known Allergies Prior to Admission medications   Medication Sig Start Date End Date Taking? Authorizing Provider  aspirin EC 81 MG tablet Take 1 tablet (81 mg total) by mouth daily. 05/26/14  Yes Nishant Dhungel, MD  atenolol (TENORMIN) 50 MG tablet Take 1 tablet (50 mg total) by mouth daily. 05/26/14  Yes Nishant Dhungel, MD  lisinopril (ZESTRIL) 2.5 MG tablet Take  1 tablet (2.5 mg total) by mouth daily. 05/26/14  Yes Nishant Dhungel, MD  methimazole (TAPAZOLE) 10 MG tablet Take 1 tablet (10 mg total) by mouth 2 (two) times daily. 05/26/14  Yes Nishant Dhungel, MD  Nutritional Supplements (COLD AND FLU PO) Take 2 capsules by mouth every 6 (six) hours as needed (cold and flu symptoms).   Yes Historical Provider, MD     ROS: The patient denies fevers, chills, night sweats, unintentional weight loss, chest pain, palpitations, wheezing, dyspnea on exertion, nausea, vomiting, abdominal pain, dysuria, hematuria, melena, numbness, weakness, or tingling.   All other systems have been reviewed and were otherwise negative with the exception of those mentioned in the HPI and as above.    PHYSICAL EXAM: Filed Vitals:   06/05/14 1855  BP: 140/60  Pulse: 97  Temp: 98.3 F (36.8 C)  Resp: 16   Filed Vitals:   06/05/14 1855  Height:  (1.778 m)  Weight: 160 lb 2 oz (72.632 kg)   Body mass index is 22.98 kg/(m^2).   General: Alert, no acute distress HEENT:  Normocephalic, atraumatic, oropharynx patent. EOMI, PERRLA, positive goiter l, arge on the right side Erythematous throat, no exudates, TM normal, + sinus tenderness, + erythematous/boggy nasal mucosa Cardiovascular:  Regular  rate and rhythm, no rubs murmurs or gallops.  No Carotid bruits, radial pulse intact. No pedal edema.  Respiratory: Clear to auscultation bilaterally.  No wheezes, rales, or rhonchi.  No cyanosis, no use of accessory musculature GI: No organomegaly, abdomen is soft and non-tender, positive bowel sounds.  No masses. Skin: No rashes. Neurologic: Facial musculature symmetric. Psychiatric: Patient is appropriate throughout our interaction. Lymphatic: No cervical lymphadenopathy Musculoskeletal: Gait intact.   LABS: Results for orders placed or performed during the hospital encounter of 05/24/14  CBC  Result Value Ref Range   WBC 5.1 4.0 - 10.5 K/uL   RBC 4.84 4.22 - 5.81  MIL/uL   Hemoglobin 12.8 (L) 13.0 - 17.0 g/dL   HCT 13.040.4 86.539.0 - 78.452.0 %   MCV 83.5 78.0 - 100.0 fL   MCH 26.4 26.0 - 34.0 pg   MCHC 31.7 30.0 - 36.0 g/dL   RDW 69.614.7 29.511.5 - 28.415.5 %   Platelets 176 150 - 400 K/uL  BNP (order ONLY if patient complains of dyspnea/SOB AND you have documented it for THIS visit)  Result Value Ref Range   B Natriuretic Peptide 483.0 (H) 0.0 - 100.0 pg/mL  Lipase, blood  Result Value Ref Range   Lipase 13 (L) 22 - 51 U/L  Comprehensive metabolic panel  Result Value Ref Range   Sodium 143 135 - 145 mmol/L   Potassium 3.8 3.5 - 5.1 mmol/L   Chloride 108 101 - 111 mmol/L   CO2 25 22 - 32 mmol/L   Glucose, Bld 107 (H) 65 - 99 mg/dL   BUN 12 6 - 20 mg/dL   Creatinine, Ser 1.320.61 0.61 - 1.24 mg/dL   Calcium 9.0 8.9 - 44.010.3 mg/dL   Total Protein 7.5 6.5 - 8.1 g/dL   Albumin 3.8 3.5 - 5.0 g/dL   AST 24 15 - 41 U/L   ALT 25 17 - 63 U/L   Alkaline Phosphatase 248 (H) 38 - 126 U/L   Total Bilirubin 0.9 0.3 - 1.2 mg/dL   GFR calc non Af Amer >60 >60 mL/min   GFR calc Af Amer >60 >60 mL/min   Anion gap 10 5 - 15  Urinalysis, Routine w reflex microscopic  Result Value Ref Range   Color, Urine YELLOW YELLOW   APPearance CLEAR CLEAR   Specific Gravity, Urine 1.009 1.005 - 1.030   pH 7.0 5.0 - 8.0   Glucose, UA NEGATIVE NEGATIVE mg/dL   Hgb urine dipstick NEGATIVE NEGATIVE   Bilirubin Urine NEGATIVE NEGATIVE   Ketones, ur NEGATIVE NEGATIVE mg/dL   Protein, ur NEGATIVE NEGATIVE mg/dL   Urobilinogen, UA 1.0 0.0 - 1.0 mg/dL   Nitrite NEGATIVE NEGATIVE   Leukocytes, UA NEGATIVE NEGATIVE  D-dimer, quantitative  Result Value Ref Range   D-Dimer, Quant 0.62 (H) 0.00 - 0.48 ug/mL-FEU  TSH  Result Value Ref Range   TSH 0.010 (L) 0.350 - 4.500 uIU/mL  Troponin I  Result Value Ref Range   Troponin I <0.03 <0.031 ng/mL  T4, free  Result Value Ref Range   Free T4 >5.50 (H) 0.61 - 1.12 ng/dL  T3  Result Value Ref Range   T3, Total >651 (H) 71 - 180 ng/dL  Thyroid  antibodies  Result Value Ref Range   Thyroperoxidase Ab SerPl-aCnc >600 (H) 0 - 34 IU/mL   Thyroglobulin Antibody 5.6 (H) 0.0 - 0.9 IU/mL  Thyroid stimulating immunoglobulin  Result Value Ref Range   TSI 496 (H) <140 % baseline  Magnesium  Result Value Ref Range   Magnesium 1.9 1.7 - 2.4 mg/dL  Phosphorus  Result Value Ref Range   Phosphorus 4.4 2.5 - 4.6 mg/dL  Comprehensive metabolic panel  Result Value Ref Range   Sodium 141 135 - 145 mmol/L   Potassium 3.5 3.5 - 5.1 mmol/L   Chloride 103 101 - 111 mmol/L   CO2 29 22 - 32 mmol/L   Glucose, Bld 113 (H) 65 - 99 mg/dL   BUN 11 6 - 20 mg/dL   Creatinine, Ser 0.45 (L) 0.61 - 1.24 mg/dL   Calcium 9.1 8.9 - 40.9 mg/dL   Total Protein 7.0 6.5 - 8.1 g/dL   Albumin 3.5 3.5 - 5.0 g/dL   AST 27 15 - 41 U/L   ALT 25 17 - 63 U/L   Alkaline Phosphatase 248 (H) 38 - 126 U/L   Total Bilirubin 1.1 0.3 - 1.2 mg/dL   GFR calc non Af Amer >60 >60 mL/min   GFR calc Af Amer >60 >60 mL/min   Anion gap 9 5 - 15  CBC WITH DIFFERENTIAL  Result Value Ref Range   WBC 4.9 4.0 - 10.5 K/uL   RBC 4.90 4.22 - 5.81 MIL/uL   Hemoglobin 12.7 (L) 13.0 - 17.0 g/dL   HCT 81.1 91.4 - 78.2 %   MCV 82.9 78.0 - 100.0 fL   MCH 25.9 (L) 26.0 - 34.0 pg   MCHC 31.3 30.0 - 36.0 g/dL   RDW 95.6 21.3 - 08.6 %   Platelets 195 150 - 400 K/uL   Neutrophils Relative % 49 43 - 77 %   Neutro Abs 2.4 1.7 - 7.7 K/uL   Lymphocytes Relative 28 12 - 46 %   Lymphs Abs 1.4 0.7 - 4.0 K/uL   Monocytes Relative 18 (H) 3 - 12 %   Monocytes Absolute 0.9 0.1 - 1.0 K/uL   Eosinophils Relative 5 0 - 5 %   Eosinophils Absolute 0.2 0.0 - 0.7 K/uL   Basophils Relative 0 0 - 1 %   Basophils Absolute 0.0 0.0 - 0.1 K/uL  Troponin I  Result Value Ref Range   Troponin I <0.03 <0.031 ng/mL  Troponin I  Result Value Ref Range   Troponin I <0.03 <0.031 ng/mL  Troponin I  Result Value Ref Range   Troponin I <0.03 <0.031 ng/mL  Hemoglobin A1c  Result Value Ref Range   Hgb A1c MFr Bld  5.7 (H) 4.8 - 5.6 %   Mean Plasma Glucose 117 mg/dL  Urinalysis, Routine w reflex microscopic  Result Value Ref Range   Color, Urine AMBER (A) YELLOW   APPearance CLEAR CLEAR   Specific Gravity, Urine 1.036 (H) 1.005 - 1.030   pH 6.5 5.0 - 8.0   Glucose, UA NEGATIVE NEGATIVE mg/dL   Hgb urine dipstick NEGATIVE NEGATIVE   Bilirubin Urine SMALL (A) NEGATIVE   Ketones, ur NEGATIVE NEGATIVE mg/dL   Protein, ur 578 (A) NEGATIVE mg/dL   Urobilinogen, UA 2.0 (H) 0.0 - 1.0 mg/dL   Nitrite NEGATIVE NEGATIVE   Leukocytes, UA NEGATIVE NEGATIVE  Urine rapid drug screen (hosp performed)  Result Value Ref Range   Opiates NONE DETECTED NONE DETECTED   Cocaine NONE DETECTED NONE DETECTED   Benzodiazepines NONE DETECTED NONE DETECTED   Amphetamines NONE DETECTED NONE DETECTED   Tetrahydrocannabinol POSITIVE (A) NONE DETECTED   Barbiturates POSITIVE (A) NONE DETECTED  HIV antibody  Result Value Ref Range   HIV Screen 4th Generation wRfx Non Reactive Non  Reactive  Urine microscopic-add on  Result Value Ref Range   Squamous Epithelial / LPF RARE RARE   WBC, UA 3-6 <3 WBC/hpf  I-stat troponin, ED  (not at [facility], [facility])  Result Value Ref Range   Troponin i, poc 0.00 0.00 - 0.08 ng/mL   Comment 3             EKG/XRAY:   Primary read interpreted by Dr. Conley Rolls at [facility].   ASSESSMENT/PLAN: Encounter Diagnoses  Name Primary?  . Acute maxillary sinusitis, recurrence not specified Yes  . Goiter   . Hyperthyroidism    Shelia Magallon is a pleasant 34 year old African-American male with a past medical history of hyperthyroid disease secondary to goiter is here for acute sinus infection. He was recently hospitalized on May 20 for what apparently was a hyperthyroid thyrotoxicosis. He was put on methimazole and has an endocrinology referral in the next week.  He has a decreased ejection fraction on echocardiogram of 40% with LVH and some global hypokinesis. The QTC was slightly prolonged. Is currently on  atenolol and lisinopril. Prescribed amoxicillin Follow up as needed  Gross sideeffects, risk and benefits, and alternatives of medications d/w patient. Patient is aware that all medications have potential sideeffects and we are unable to predict every sideeffect or drug-drug interaction that may occur.  Girtie Wiersma PHUONG, DO 06/07/2014 2:10 AM

## 2014-06-07 ENCOUNTER — Encounter: Payer: Self-pay | Admitting: Family Medicine

## 2014-06-20 ENCOUNTER — Telehealth: Payer: Self-pay | Admitting: Endocrinology

## 2014-06-20 NOTE — Telephone Encounter (Signed)
Patient has never been seen here before, cannot prescribe him anything until he's seen in the office.

## 2014-06-20 NOTE — Telephone Encounter (Signed)
Pt need refill of medication to get him threw until his apt,  ( atenolol, lisinopril 2.5 mg, methimazole) please call pt

## 2014-06-25 ENCOUNTER — Other Ambulatory Visit: Payer: Self-pay | Admitting: Radiology

## 2014-06-25 MED ORDER — LISINOPRIL 2.5 MG PO TABS: 2.5000 mg | ORAL_TABLET | Freq: Every day | ORAL | Status: DC

## 2014-06-25 MED ORDER — METHIMAZOLE 10 MG PO TABS: 10.0000 mg | ORAL_TABLET | Freq: Two times a day (BID) | ORAL | Status: DC

## 2014-06-25 MED ORDER — ATENOLOL 50 MG PO TABS: 50.0000 mg | ORAL_TABLET | Freq: Every day | ORAL | Status: DC

## 2014-06-25 NOTE — Telephone Encounter (Signed)
Pt does not have endocrinology appt until July 25th. Can we refill his meds until then?

## 2014-06-26 ENCOUNTER — Telehealth: Payer: Self-pay | Admitting: Radiology

## 2014-06-26 NOTE — Telephone Encounter (Signed)
Faxed Rxs (which printed) and notified pt.

## 2014-07-01 ENCOUNTER — Ambulatory Visit: Payer: Managed Care, Other (non HMO) | Admitting: Endocrinology

## 2014-07-02 ENCOUNTER — Other Ambulatory Visit: Payer: Self-pay

## 2014-07-02 MED ORDER — LISINOPRIL 2.5 MG PO TABS: 2.5000 mg | ORAL_TABLET | Freq: Every day | ORAL | Status: DC

## 2014-07-02 MED ORDER — METHIMAZOLE 10 MG PO TABS: 10.0000 mg | ORAL_TABLET | Freq: Two times a day (BID) | ORAL | Status: DC

## 2014-07-02 MED ORDER — ATENOLOL 50 MG PO TABS: 50.0000 mg | ORAL_TABLET | Freq: Every day | ORAL | Status: DC

## 2014-07-29 ENCOUNTER — Ambulatory Visit (INDEPENDENT_AMBULATORY_CARE_PROVIDER_SITE_OTHER): Payer: PRIVATE HEALTH INSURANCE | Admitting: Endocrinology

## 2014-07-29 ENCOUNTER — Encounter: Payer: Self-pay | Admitting: Endocrinology

## 2014-07-29 VITALS — BP 124/82 | HR 61 | Temp 98.3°F | Resp 16 | Ht 70.0 in | Wt 163.2 lb

## 2014-07-29 DIAGNOSIS — I429 Cardiomyopathy, unspecified: Secondary | ICD-10-CM

## 2014-07-29 DIAGNOSIS — E0590 Thyrotoxicosis, unspecified without thyrotoxic crisis or storm: Secondary | ICD-10-CM

## 2014-07-29 LAB — TSH: TSH: 0.03 u[IU]/mL — ABNORMAL LOW (ref 0.35–4.50)

## 2014-07-29 LAB — T4, FREE: FREE T4: 0.6 ng/dL (ref 0.60–1.60)

## 2014-07-29 NOTE — Patient Instructions (Signed)
Reduce atenolol to half tablet daily Methimazole dose will be adjusted based on today's blood tests  you will be scheduled for radioactive iodine uptake test: you will go in the first day for the radio active iodine capsule and the next day for the measurement of the activity retained in the thyroid STOP methimazole about 7 days prior to this scheduled test  The radioactive iodine TREATMENT will be scheduled subsequently  Restart METHIMAZOLE 3 days after the radioactive iodine treatment (not the test dose)

## 2014-07-29 NOTE — Progress Notes (Signed)
Patient ID: Greg Patrick, male   DOB: 10-26-1980, 34 y.o.   MRN: [id number]                                                                                                               Reason for Appointment:  Hyperthyroidism, new consultation  Referring physician: None   History of Present Illness: 2-3 yrs  Since about 2-3 years ago patient has had symptoms of shakiness, feeling excessively hot and sweaty, some palpitations and fatigue. Symptoms were gradual in onset and he did not seek any attention for these. Although he thinks he lost only 5 pounds he thinks his weight was about 170 pounds prior to his symptoms starting and in May his weight was recorded as 148  He was admitted to the hospital with chest pain and shortness of breath but was found to be tachycardic and had a large goiter also   The patient was evaluated with thyroid function tests which showed the following:     Lab Results  Component Value Date   FREET4 >5.50* 05/24/2014   TSH 0.010* 05/25/2014   Wt Readings from Last 3 Encounters:  07/29/14 163 lb 3.2 oz (74.027 kg)  06/05/14 160 lb 2 oz (72.632 kg)  05/25/14 148 lb 6.4 oz (67.314 kg)    He was started on methimazole in 05/2014 at the hospital but he has not been able to come for his consultation until today He has been able to take his methimazole 10 mg twice a day regularly along with atenolol 50 mg daily With starting these medications he has had less symptoms described above and has more energy His weight is improving       Medication List       This list is accurate as of: 07/29/14  3:25 PM.  Always use your most recent med list.               aspirin EC 81 MG tablet  Take 1 tablet (81 mg total) by mouth daily.     atenolol 50 MG tablet  Commonly known as:  TENORMIN  Take 1 tablet (50 mg total) by mouth daily.     COLD AND FLU PO  Take 2 capsules by mouth every 6 (six) hours as needed (cold and flu symptoms).     lisinopril 2.5 MG  tablet  Commonly known as:  ZESTRIL  Take 1 tablet (2.5 mg total) by mouth daily.     methimazole 10 MG tablet  Commonly known as:  TAPAZOLE  Take 1 tablet (10 mg total) by mouth 2 (two) times daily.            Past Medical History  Diagnosis Date  . Asthma   . Pancreatitis   . Thyroid disease     Hyperthyroid secondary to goiter    No past surgical history on file.  Family History  Problem Relation Age of Onset  . Deep vein thrombosis Mother   . Hypertension Father     Social History:  reports that he has been smoking Cigarettes.  He has never used smokeless tobacco. He reports that he drinks alcohol. He reports that he does not use illicit drugs.  Allergies:  Allergies  Allergen Reactions  . Bee Venom Anaphylaxis  . Fish-Derived Products Anaphylaxis    Review of Systems:  Has no  history of high blood pressure.  Apparently was started on 2.5 mg Zestril at the hospital discharge time       No history of dyspnea on exertion recently .      No complaints of change in bowel habits.      There is no history of Diabetes.      No swelling of legs present     No history of joint pains or muscle aches      Examination:   BP 124/82 mmHg  Pulse 61  Temp(Src) 98.3 F (36.8 C)  Resp 16  Ht  (1.778 m)  Wt 163 lb 3.2 oz (74.027 kg)  BMI 23.42 kg/m2  SpO2 97%   General Appearance:  well-built and nourished, pleasant, not anxious or hyperkinetic.        Eyes: No excessive prominence, has mild lid lag but no significant stare. No swelling of the eyelids  Neck: The thyroid is enlarged 4 times normal, smooth, non-tender and diffuse.  Bruit heard on the right lobe  There is no lymphadenopathy .          Heart:  he has a visible and palpable significant precordial pulsation both parasternally and over the apex; normal S1 but has loud widely split S2 with prominent second sound, no murmurs.          Lungs: breath sounds are clear bilaterally Abdomen: no  hepatosplenomegaly or other palpable abnormality  Extremities: hands are warm. No ankle edema. Neurological: Deep tendon reflexes at biceps are brisk. No fine tremors are present.  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   Discussed with the patient the hyperthyroidism as being an autoimmune thyroid disease.  Explained the options for treatment including antithyroid drugs and radioactive iodine. Discussed the pros and cons for each treatment: Antithyroid drugs would be reasonable for mild disease but he has had very significant hyperthyroidism and has a very large goiter also  Although he subjectively feeling quite well and his pulse is controlled he still has some features subjectively of hyperthyroidism and is only taking 10 mg twice a day of methimazole which is unlikely to be effective in controlling his markedly increased thyroid levels at baseline  Discussed that I-131 treatment is safe and simple to do but will result in long-term hypothyroidism that will result from ablation of the thyroid tissue and the need for lifelong supplementation and periodic monitoring. This would be the preferred treatment given his clinical picture  Patient understands the above discussion and treatment options. All questions were answered satisfactorily  He will be scheduled for I 131 uptake with the following instructions:  Patient Instructions  Reduce atenolol to half tablet daily Methimazole dose will be adjusted based on today's blood tests  you will be scheduled for radioactive iodine uptake test: you will go in the first day for the radio active iodine capsule and the next day for the measurement of the activity retained in the thyroid STOP methimazole about 7 days prior  to this scheduled test  The radioactive  iodine TREATMENT will be scheduled subsequently  Restart METHIMAZOLE 3 days after the radioactive iodine treatment (not the test dose)   PROBLEM 2: He appears to have cardiomyopathy with decreased left ventricular function, global hypokinesis and also clinical and echocardiographic evidence of pulmonary hypertension which may be separate from the hyperthyroidism that normally causes hyperdynamic LV action. Will consider evaluation with cardiologist also once his hyperthyroidism is controlled  Meanwhile will have him stop his lisinopril which is a relatively small dose along with reducing his atenolol to 25 mg because of his mild bradycardia  Alta Shober 07/29/2014, 3:25 PM

## 2014-07-30 ENCOUNTER — Other Ambulatory Visit: Payer: Self-pay | Admitting: *Deleted

## 2014-07-30 MED ORDER — METHIMAZOLE 10 MG PO TABS: ORAL_TABLET | ORAL | Status: DC

## 2014-07-30 NOTE — Progress Notes (Signed)
Quick Note:  Please let patient know that the thyroid level is within normal range, will hold off on radioactive iodine treatment at this time. Needs free T3 added Reduce methimazole to 1 tablet in a.m. and half in the evening, needs new prescription, keep scheduled appointment   ______

## 2014-07-31 ENCOUNTER — Other Ambulatory Visit (INDEPENDENT_AMBULATORY_CARE_PROVIDER_SITE_OTHER): Payer: PRIVATE HEALTH INSURANCE

## 2014-07-31 DIAGNOSIS — E0590 Thyrotoxicosis, unspecified without thyrotoxic crisis or storm: Secondary | ICD-10-CM

## 2014-07-31 LAB — T3, FREE: T3 FREE: 4 pg/mL (ref 2.3–4.2)

## 2014-09-02 ENCOUNTER — Other Ambulatory Visit (INDEPENDENT_AMBULATORY_CARE_PROVIDER_SITE_OTHER): Payer: PRIVATE HEALTH INSURANCE

## 2014-09-02 DIAGNOSIS — E0590 Thyrotoxicosis, unspecified without thyrotoxic crisis or storm: Secondary | ICD-10-CM

## 2014-09-02 LAB — T4, FREE: FREE T4: 0.37 ng/dL — AB (ref 0.60–1.60)

## 2014-09-05 ENCOUNTER — Encounter: Payer: Self-pay | Admitting: Endocrinology

## 2014-09-05 ENCOUNTER — Ambulatory Visit (INDEPENDENT_AMBULATORY_CARE_PROVIDER_SITE_OTHER): Payer: 59 | Admitting: Endocrinology

## 2014-09-05 VITALS — BP 136/82 | HR 81 | Temp 98.2°F | Resp 14 | Ht 68.0 in | Wt 166.2 lb

## 2014-09-05 DIAGNOSIS — E0590 Thyrotoxicosis, unspecified without thyrotoxic crisis or storm: Secondary | ICD-10-CM | POA: Diagnosis not present

## 2014-09-05 NOTE — Patient Instructions (Signed)
No methimazole

## 2014-09-05 NOTE — Progress Notes (Signed)
Patient ID: Greg Patrick, male   DOB: 27-Sep-1980, 34 y.o.   MRN: [id number]                                                                                                               Reason for Appointment:  Hyperthyroidism, follow-up  Referring physician: None   History of Present Illness:   Background history: Since about 2-3 years ago patient  had symptoms of shakiness, feeling excessively hot and sweaty, some palpitations and fatigue. Although he thinks he lost only 5 pounds he thinks his weight was about 170 pounds prior to his symptoms; in May his weight was recorded as 148 He was admitted to the hospital with chest pain and shortness of breath but was found to be tachycardic and had a large goiter also   The patient was evaluated with thyroid function tests which showed hyperthyroidism    He was started on methimazole in 05/2014 at the hospital but he was finally seen in follow-up in July  At that time because of his low normal free T4 his methimazole was reduced to 15 mg a day  Recent history: He has been taking his medication regularly but is not taking atenolol He thinks the size of his thyroid enlargement is improved He does not feel any fatigue, shortness of breath, palpitations, heat or cold intolerance His weight is improving  Wt Readings from Last 3 Encounters:  09/05/14 166 lb 3.2 oz (75.388 kg)  07/29/14 163 lb 3.2 oz (74.027 kg)  06/05/14 160 lb 2 oz (72.632 kg)   His free T4 level now is however below normal.  Lab Results  Component Value Date   TSH 0.03* 07/29/2014   TSH 0.010* 05/25/2014   FREET4 0.37* 09/02/2014   FREET4 0.60 07/29/2014   FREET4 >5.50* 05/24/2014        Medication List       This list is accurate as of: 09/05/14  3:12 PM.  Always use your most recent med list.               aspirin EC 81 MG tablet  Take 1 tablet (81 mg total) by mouth daily.     atenolol 50 MG tablet  Commonly known as:  TENORMIN  Take 1 tablet (50 mg  total) by mouth daily.     COLD AND FLU PO  Take 2 capsules by mouth every 6 (six) hours as needed (cold and flu symptoms).     lisinopril 2.5 MG tablet  Commonly known as:  ZESTRIL  Take 1 tablet (2.5 mg total) by mouth daily.     methimazole 10 MG tablet  Commonly known as:  TAPAZOLE  Take 1 tablet in the morning and half tablet in the evening.            Past Medical History  Diagnosis Date  . Asthma   . Pancreatitis   . Thyroid disease     Hyperthyroid secondary to goiter    No past surgical history on file.  Family  History  Problem Relation Age of Onset  . Deep vein thrombosis Mother   . Hypertension Father     Social History:  reports that he has been smoking Cigarettes.  He has never used smokeless tobacco. He reports that he drinks alcohol. He reports that he does not use illicit drugs.  Allergies:  Allergies  Allergen Reactions  . Bee Venom Anaphylaxis  . Fish-Derived Products Anaphylaxis    Review of Systems:  Has no  history of high blood pressure.  Apparently was started on 2.5 mg Zestril at the hospital discharge time       No history of dyspnea on exertion recently .      No complaints of change in bowel habits.      There is no history of Diabetes.      No swelling of legs present     No history of joint pains or muscle aches      Examination:   BP 136/82 mmHg  Pulse 81  Temp(Src) 98.2 F (36.8 C)  Resp 14  Ht  (1.727 m)  Wt 166 lb 3.2 oz (75.388 kg)  BMI 25.28 kg/m2  SpO2 96%   General Appearance:  well-built and nourished,  looks well   Neck: The thyroid is enlarged about 3-4 times normal on the right and about twice normal on the left. It is  smooth, and relatively soft in the right.  Bruit heard on the right lobe   There is no lymphadenopathy .          Heart:  he has A split second sound but no S3 or S4, heart rate is regular and 72  Extremities: hands are  warm. 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 With taking methimazole since about 5/16 he appears now to be becoming hypothyroid with taking 15 mg of methimazole; his free T4 is very low at 0.37 Clinically he is doing well but still has a significant goiter especially on the right side   Since he may be getting into remission with his hyperthyroidism will stop his methimazole and follow-up in one month   Greg Patrick 09/05/2014, 3:12 PM

## 2014-10-08 ENCOUNTER — Other Ambulatory Visit (INDEPENDENT_AMBULATORY_CARE_PROVIDER_SITE_OTHER): Payer: 59

## 2014-10-08 ENCOUNTER — Other Ambulatory Visit: Payer: 59

## 2014-10-08 DIAGNOSIS — E0590 Thyrotoxicosis, unspecified without thyrotoxic crisis or storm: Secondary | ICD-10-CM | POA: Diagnosis not present

## 2014-10-08 LAB — TSH: TSH: 0.01 u[IU]/mL — ABNORMAL LOW (ref 0.35–4.50)

## 2014-10-08 LAB — T4, FREE: FREE T4: 3.94 ng/dL — AB (ref 0.60–1.60)

## 2014-10-11 ENCOUNTER — Encounter: Payer: Self-pay | Admitting: Endocrinology

## 2014-10-11 ENCOUNTER — Ambulatory Visit (INDEPENDENT_AMBULATORY_CARE_PROVIDER_SITE_OTHER): Payer: 59 | Admitting: Endocrinology

## 2014-10-11 VITALS — BP 136/74 | HR 107 | Temp 98.2°F | Resp 14 | Ht 68.0 in | Wt 160.0 lb

## 2014-10-11 DIAGNOSIS — E0590 Thyrotoxicosis, unspecified without thyrotoxic crisis or storm: Secondary | ICD-10-CM

## 2014-10-11 MED ORDER — METHIMAZOLE 10 MG PO TABS: 10.0000 mg | ORAL_TABLET | Freq: Two times a day (BID) | ORAL | Status: DC

## 2014-10-11 NOTE — Patient Instructions (Signed)
You will be scheduled first for the RADIOACTIVE iodine uptake test which will take 2 days If this is more than a week away start taking METHIMAZOLE 1 tablet twice a day and stop this one week before the appointment for the test  After the radioactive iodine test is done the actual TREATMENT will be ordered at the same radiology location Avoid contact with small children for the next 2 days after this and use a separate toilet for your use, other precautions will be given by nuclear medicine technician  3 DAYS after the actual radioactive iodine treatment start back on the methimazole until the next visit

## 2014-10-11 NOTE — Progress Notes (Signed)
Patient ID: Greg Patrick, male   DOB: October 24, 1980, 34 y.o.   MRN: [id number]                                                                                                               Reason for Appointment:  Hyperthyroidism, follow-up  Referring physician: None   History of Present Illness:   Background history: Since about 2-3 years ago patient  had symptoms of shakiness, feeling excessively hot and sweaty, some palpitations and fatigue. Although he thinks he lost only 5 pounds he thinks his weight was about 170 pounds prior to his symptoms; in May his weight was recorded as 148 He was admitted to the hospital with chest pain and shortness of breath but was found to be tachycardic and hyperthyroid He was started on methimazole in 05/2014 at the hospital but he was finally seen in follow-up in July  Recent history: His methimazole was reduced and then on his last visit because of free T4 being low at 0.37 methimazole was stopped completely He does not feel any fatigue, shortness of breath or change in appetite or bowels He does feel more sweaty and warm recently and occasional palpitations His weight is reduced without change in appetite  Wt Readings from Last 3 Encounters:  10/11/14 160 lb (72.576 kg)  09/05/14 166 lb 3.2 oz (75.388 kg)  07/29/14 163 lb 3.2 oz (74.027 kg)    His free T4 level now is markedly increased again  Lab Results  Component Value Date   TSH 0.01* 10/08/2014   TSH 0.03* 07/29/2014   TSH 0.010* 05/25/2014   FREET4 3.94* 10/08/2014   FREET4 0.37* 09/02/2014   FREET4 0.60 07/29/2014        Medication List       This list is accurate as of: 10/11/14 11:59 PM.  Always use your most recent med list.               aspirin EC 81 MG tablet  Take 1 tablet (81 mg total) by mouth daily.     lisinopril 2.5 MG tablet  Commonly known as:  ZESTRIL  Take 1 tablet (2.5 mg total) by mouth daily.     methimazole 10 MG tablet  Commonly known as:   TAPAZOLE  Take 1 tablet (10 mg total) by mouth 2 (two) times daily.            Past Medical History  Diagnosis Date  . Asthma   . Pancreatitis   . Thyroid disease     Hyperthyroid secondary to goiter    No past surgical history on file.  Family History  Problem Relation Age of Onset  . Deep vein thrombosis Mother   . Hypertension Father     Social History:  reports that he has been smoking Cigarettes.  He has never used smokeless tobacco. He reports that he drinks alcohol. He reports that he does not use illicit drugs.  Allergies:  Allergies  Allergen Reactions  . Bee Venom Anaphylaxis  . Fish-Derived  Products Anaphylaxis    Review of Systems:      No swelling of legs present       Examination:   BP 136/74 mmHg  Pulse 107  Temp(Src) 98.2 F (36.8 C)  Resp 14  Ht  (1.727 m)  Wt 160 lb (72.576 kg)  BMI 24.33 kg/m2  SpO2 96%   General Appearance:  well-built and nourished,  looks well   Neck: The thyroid is enlarged about 3-4 times normal on the right and about twice normal on the left. It is relatively soft in the right.    Extremities: hands are  warm. Deep tendon reflexes at biceps difficult to elicit but appear slightly brisk No tremor    Assessment/Plan:   Hyperthyroidism, likely to be from Graves' disease associated with large goiter Although he had become hypothyroid with taking 15 mg of methimazole with stopping the medication he is frankly hyperthyroid again with high free T4 Still has a significantly large goiter  Discussed options of treatment and because of his severe native disease and large goiter needs to be treated with I-131 Discussed in detail the rationale for using I-131 as well as process of doing this and outcomes  He will restart methimazole now until 1 week before his uptake test and also resume 3 days after his I-131 treatment Follow-up about 3 weeks after I-131 treatment  Greg Patrick 10/12/2014, 3:21 PM

## 2014-10-22 ENCOUNTER — Ambulatory Visit (INDEPENDENT_AMBULATORY_CARE_PROVIDER_SITE_OTHER): Payer: PRIVATE HEALTH INSURANCE | Admitting: Physician Assistant

## 2014-10-22 VITALS — BP 140/90 | HR 80 | Temp 98.0°F | Resp 16 | Ht 70.5 in | Wt 166.0 lb

## 2014-10-22 DIAGNOSIS — R030 Elevated blood-pressure reading, without diagnosis of hypertension: Secondary | ICD-10-CM

## 2014-10-22 DIAGNOSIS — J0100 Acute maxillary sinusitis, unspecified: Secondary | ICD-10-CM

## 2014-10-22 DIAGNOSIS — IMO0001 Reserved for inherently not codable concepts without codable children: Secondary | ICD-10-CM

## 2014-10-22 MED ORDER — HM BLOOD PRESSURE MONITOR DEVI: 1.0000 | Freq: Once | Status: DC

## 2014-10-22 MED ORDER — AMOXICILLIN 875 MG PO TABS: 875.0000 mg | ORAL_TABLET | Freq: Two times a day (BID) | ORAL | Status: AC

## 2014-10-22 NOTE — Patient Instructions (Signed)
Take amoxicillin twice a day for 10 days. Return in 7 days if symptoms not improving. Start taking BP readings at home and record. Take to next endo visit.

## 2014-10-22 NOTE — Progress Notes (Signed)
[facility] [address] [place] [address] [phone]  Date:  10/22/2014   Name:  Greg Patrick   DOB:  26-Sep-1980   MRN:  [id number]  PCP:  No PCP Per Patient    Chief Complaint: Facial Pain and Sinus Congestion   History of Present Illness:  This is a 34 y.o. male with PMH hyperthyroidism who is presenting with sinus congestion and facial pain x 4 days. States the right side of his face is painful to touch and his upper right teeth are starting to throb. He has mild sore throat. He denies otalgia, cough, fever, chills. He gets a sinus infection once every couple years. He has not taken anything for his symptoms other than tylenol. He denies a hx of asthma or env allergies.  He is currently being seen by endocrinology, Dr. Lucianne Muss, for hyperthyroidism. He is on methimazole. They are planning to start radioactive iodine soon. He was first seen in may 2016 in the ED for thyroid storm. At that time he was dx'd with cardiomegaly and secondary cardiomyopathy. He was started on aspirin and lisinopril. He was last seen 1 week ago by Dr. Cathie Beams. He was told at that time to stop lisinopril and aspirin. BP here today 140/90.  Review of Systems:  Review of Systems See HPI  Patient Active Problem List   Diagnosis Date Noted  . Hyperthyroidism 05/26/2014  . Secondary cardiomyopathy (HCC) 05/26/2014  . Chest pain 05/25/2014  . Goiter 05/25/2014  . Chest tightness 05/25/2014  . Cardiomegaly 05/25/2014  . Thyrotoxicosis 05/25/2014  . QT prolongation 05/25/2014  . Thyromegaly     Prior to Admission medications   Medication Sig Start Date End Date Taking? Authorizing Provider  methimazole (TAPAZOLE) 10 MG tablet Take 1 tablet (10 mg total) by mouth 2 (two) times daily. 10/11/14  Yes Reather Littler, MD  aspirin EC 81 MG tablet Take 1 tablet (81 mg total) by mouth daily. Patient not taking: Reported on 10/11/2014 05/26/14   Nishant Dhungel, MD  lisinopril (ZESTRIL) 2.5 MG  tablet Take 1 tablet (2.5 mg total) by mouth daily. Patient not taking: Reported on 10/11/2014 07/02/14   Thao P Le, DO    Allergies  Allergen Reactions  . Bee Venom Anaphylaxis  . Fish-Derived Products Anaphylaxis    History reviewed. No pertinent past surgical history.  Social History  Substance Use Topics  . Smoking status: Current Every Day Smoker    Types: Cigarettes  . Smokeless tobacco: Never Used  . Alcohol Use: 0.0 oz/week    0 Standard drinks or equivalent per week     Comment: Socially    Family History  Problem Relation Age of Onset  . Deep vein thrombosis Mother   . Hypertension Father     Medication list has been reviewed and updated.  Physical Examination:  Physical Exam  Constitutional: He is oriented to person, place, and time. He appears well-developed and well-nourished. No distress.  HENT:  Head: Normocephalic and atraumatic.  Right Ear: Hearing, tympanic membrane, external ear and ear canal normal.  Left Ear: Hearing, tympanic membrane, external ear and ear canal normal.  Nose: Right sinus exhibits maxillary sinus tenderness (significant). Right sinus exhibits no frontal sinus tenderness. Left sinus exhibits no maxillary sinus tenderness and no frontal sinus tenderness.  Mouth/Throat: Uvula is midline, oropharynx is clear and moist and mucous membranes are normal.  Eyes: Conjunctivae and lids are normal. Right eye exhibits no discharge. Left eye exhibits no discharge.  No scleral icterus.  Neck: Thyromegaly (goiter present) present.  Cardiovascular: Normal rate, regular rhythm, normal heart sounds and normal pulses.   No murmur heard. Pulmonary/Chest: Effort normal and breath sounds normal. No respiratory distress. He has no wheezes. He has no rhonchi. He has no rales.  Musculoskeletal: Normal range of motion.  Lymphadenopathy:       Head (right side): No submental, no submandibular and no tonsillar adenopathy present.       Head (left side): No  submental, no submandibular and no tonsillar adenopathy present.    He has no cervical adenopathy.  Neurological: He is alert and oriented to person, place, and time.  Skin: Skin is warm, dry and intact. No lesion and no rash noted.  Psychiatric: He has a normal mood and affect. His speech is normal and behavior is normal. Thought content normal.   BP 140/90 mmHg  Pulse 80  Temp(Src) 98 F (36.7 C) (Oral)  Resp 16  Ht 5' 10.5" (1.791 m)  Wt 166 lb (75.297 kg)  BMI 23.47 kg/m2  SpO2 98%  Assessment and Plan:  1. Elevated BP Advised he buy a BP monitor and keep record at home. Readdress with Dr. Lucianne MussKumar at next visit in November. - Blood Pressure Monitoring (HM BLOOD PRESSURE MONITOR) DEVI; 1 Device by Does not apply route once.  Dispense: 1 Device; Refill: 0  2. Acute maxillary sinusitis, recurrence not specified - amoxicillin (AMOXIL) 875 MG tablet; Take 1 tablet (875 mg total) by mouth 2 (two) times daily.  Dispense: 20 tablet; Refill: 0   Roswell MinersNicole V. Dyke BrackettBush, PA-C, MHS [facility]  10/22/2014

## 2014-11-12 ENCOUNTER — Other Ambulatory Visit (INDEPENDENT_AMBULATORY_CARE_PROVIDER_SITE_OTHER): Payer: 59

## 2014-11-12 ENCOUNTER — Other Ambulatory Visit: Payer: 59

## 2014-11-12 DIAGNOSIS — E0590 Thyrotoxicosis, unspecified without thyrotoxic crisis or storm: Secondary | ICD-10-CM | POA: Diagnosis not present

## 2014-11-12 LAB — T4, FREE: Free T4: 0.52 ng/dL — ABNORMAL LOW (ref 0.60–1.60)

## 2014-11-15 ENCOUNTER — Encounter: Payer: Self-pay | Admitting: Endocrinology

## 2014-11-15 ENCOUNTER — Ambulatory Visit (INDEPENDENT_AMBULATORY_CARE_PROVIDER_SITE_OTHER): Payer: 59 | Admitting: Endocrinology

## 2014-11-15 VITALS — BP 144/94 | HR 76 | Temp 98.3°F | Resp 14 | Ht 70.5 in | Wt 168.2 lb

## 2014-11-15 DIAGNOSIS — E0590 Thyrotoxicosis, unspecified without thyrotoxic crisis or storm: Secondary | ICD-10-CM | POA: Diagnosis not present

## 2014-11-15 NOTE — Progress Notes (Signed)
Patient ID: Greg Patrick, male   DOB: 1980/02/10, 34 y.o.   MRN: [id number]                                                                                                               Reason for Appointment:  Hyperthyroidism, follow-up  Referring physician: None   History of Present Illness:   Background history: Since about 2-3 years ago patient  had symptoms of shakiness, feeling excessively hot and sweaty, some palpitations and fatigue. Although he thinks he lost only 5 pounds he thinks his weight was about 170 pounds prior to his symptoms; in May his weight was recorded as 148 He was admitted to the hospital with chest pain and shortness of breath but was found to be tachycardic and hyperthyroid He was started on methimazole in 05/2014 at the hospital but he was finally seen in follow-up in July  Recent history: His methimazole was restarted on his last visit because of marked increase in free T4 level, previously had become hypothyroid with 15 mg methimazole He has not had as much 8 intolerance and has regained weight with this  He does not feel any fatigue   Wt Readings from Last 3 Encounters:  11/15/14 168 lb 3.2 oz (76.295 kg)  10/22/14 166 lb (75.297 kg)  10/11/14 160 lb (72.576 kg)    His free T4 level now is slightly low  Lab Results  Component Value Date   TSH 0.01* 10/08/2014   TSH 0.03* 07/29/2014   TSH 0.010* 05/25/2014   FREET4 0.52* 11/12/2014   FREET4 3.94* 10/08/2014   FREET4 0.37* 09/02/2014        Medication List       This list is accurate as of: 11/15/14  2:14 PM.  Always use your most recent med list.               aspirin EC 81 MG tablet  Take 1 tablet (81 mg total) by mouth daily.     HM BLOOD PRESSURE MONITOR Devi  1 Device by Does not apply route once.     lisinopril 2.5 MG tablet  Commonly known as:  ZESTRIL  Take 1 tablet (2.5 mg total) by mouth daily.     methimazole 10 MG tablet  Commonly known as:  TAPAZOLE  Take 1  tablet (10 mg total) by mouth 2 (two) times daily.            Past Medical History  Diagnosis Date  . Asthma   . Pancreatitis   . Thyroid disease     Hyperthyroid secondary to goiter    No past surgical history on file.  Family History  Problem Relation Age of Onset  . Deep vein thrombosis Mother   . Hypertension Father     Social History:  reports that he has been smoking Cigarettes.  He has never used smokeless tobacco. He reports that he drinks alcohol. He reports that he does not use illicit drugs.  Allergies:  Allergies  Allergen Reactions  .  Bee Venom Anaphylaxis  . Fish-Derived Products Anaphylaxis    Review of Systems:      No swelling of legs present       Examination:   BP 144/94 mmHg  Pulse 76  Temp(Src) 98.3 F (36.8 C)  Resp 14  Ht 5' 10.5" (1.791 m)  Wt 168 lb 3.2 oz (76.295 kg)  BMI 23.79 kg/m2  SpO2 98%  He looks well, no evidence of the face or eyes   Neck: The thyroid is enlarged about 3-4 times normal on the right and about twice normal on the left. It is  soft in the right.   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

## 2014-11-15 NOTE — Patient Instructions (Addendum)
Take 1 pill daily    You will be scheduled first for the RADIOACTIVE iodine uptake test which will take 2 days   stop THE PILL one week before the appointment for the test  After the radioactive iodine test is done the actual TREATMENT will be ordered at the same radiology location Avoid contact with small children for the next 2 days after this and use a separate toilet for your use, other precautions will be given by nuclear medicine technician  3 DAYS after the actual radioactive iodine treatment start back on the methimazole 1 DAILY until the next visit

## 2014-12-24 ENCOUNTER — Other Ambulatory Visit (INDEPENDENT_AMBULATORY_CARE_PROVIDER_SITE_OTHER): Payer: PRIVATE HEALTH INSURANCE

## 2014-12-24 DIAGNOSIS — E0590 Thyrotoxicosis, unspecified without thyrotoxic crisis or storm: Secondary | ICD-10-CM | POA: Diagnosis not present

## 2014-12-24 LAB — T4, FREE: FREE T4: 0.3 ng/dL — AB (ref 0.60–1.60)

## 2014-12-26 ENCOUNTER — Other Ambulatory Visit (INDEPENDENT_AMBULATORY_CARE_PROVIDER_SITE_OTHER): Payer: PRIVATE HEALTH INSURANCE

## 2014-12-26 DIAGNOSIS — E0590 Thyrotoxicosis, unspecified without thyrotoxic crisis or storm: Secondary | ICD-10-CM

## 2014-12-26 LAB — TSH: TSH: 0.04 u[IU]/mL — ABNORMAL LOW (ref 0.35–4.50)

## 2014-12-27 ENCOUNTER — Ambulatory Visit: Payer: 59 | Admitting: Endocrinology

## 2014-12-27 ENCOUNTER — Ambulatory Visit: Payer: PRIVATE HEALTH INSURANCE | Admitting: Endocrinology

## 2015-01-15 ENCOUNTER — Ambulatory Visit (INDEPENDENT_AMBULATORY_CARE_PROVIDER_SITE_OTHER): Payer: PRIVATE HEALTH INSURANCE | Admitting: Endocrinology

## 2015-01-15 ENCOUNTER — Encounter: Payer: Self-pay | Admitting: Endocrinology

## 2015-01-15 VITALS — BP 126/82 | HR 72 | Temp 98.3°F | Resp 14 | Ht 70.5 in | Wt 178.6 lb

## 2015-01-15 DIAGNOSIS — E0590 Thyrotoxicosis, unspecified without thyrotoxic crisis or storm: Secondary | ICD-10-CM | POA: Diagnosis not present

## 2015-01-15 NOTE — Progress Notes (Signed)
Patient ID: Greg Patrick, male   DOB: 21-Mar-1980, 35 y.o.   MRN: [id number]                                                                                                               Reason for Appointment:  Hyperthyroidism, follow-up  Referring physician: None   History of Present Illness:   Background history: Since about 2-3 years ago patient  had symptoms of shakiness, feeling excessively hot and sweaty, some palpitations and fatigue. Although he thinks he lost only 5 pounds he thinks his weight was about 170 pounds prior to his symptoms; in May his weight was recorded as 148 He was admitted to the hospital with chest pain and shortness of breath but was found to be tachycardic and hyperthyroid He was started on methimazole in 05/2014 at the hospital but he was finally seen in follow-up in July  Recent history: His methimazole was restarted in 10/16 when he again had a marked increase in free T4 level, previously had become hypothyroid with 15 mg methimazole  However because of a low free T4 and 20 mg methimazole he was told to reduce the dose to 10 mg in 11/16 He says he feels fairly good without any unusual fatigue but is concerned about weight gain Also having some leg cramps now No cold intolerance  However despite reducing the dose his free T4 was significantly low in 12/16, he is back for follow-up today   He is scheduled for his radio active iodine test tomorrow and has not taken his methimazole for 4 days   Wt Readings from Last 3 Encounters:  01/15/15 178 lb 9.6 oz (81.012 kg)  11/15/14 168 lb 3.2 oz (76.295 kg)  10/22/14 166 lb (75.297 kg)      Lab Results  Component Value Date   TSH 0.04* 12/26/2014   TSH 0.01* 10/08/2014   TSH 0.03* 07/29/2014   FREET4 0.30* 12/24/2014   FREET4 0.52* 11/12/2014   FREET4 3.94* 10/08/2014        Medication List       This list is accurate as of: 01/15/15  3:31 PM.  Always use your most recent med list.               aspirin EC 81 MG tablet  Take 1 tablet (81 mg total) by mouth daily.     HM BLOOD PRESSURE MONITOR Devi  1 Device by Does not apply route once.     lisinopril 2.5 MG tablet  Commonly known as:  ZESTRIL  Take 1 tablet (2.5 mg total) by mouth daily.     methimazole 10 MG tablet  Commonly known as:  TAPAZOLE  Take 1 tablet (10 mg total) by mouth 2 (two) times daily.            Past Medical History  Diagnosis Date  . Asthma   . Pancreatitis   . Thyroid disease     Hyperthyroid secondary to goiter    No past surgical history on file.  Family History  Problem Relation Age of Onset  . Deep vein thrombosis Mother   . Hypertension Father     Social History:  reports that he has been smoking Cigarettes.  He has never used smokeless tobacco. He reports that he drinks alcohol. He reports that he does not use illicit drugs.  Allergies:  Allergies  Allergen Reactions  . Bee Venom Anaphylaxis  . Fish-Derived Products Anaphylaxis    Review of Systems:         Examination:   BP 126/82 mmHg  Pulse 72  Temp(Src) 98.3 F (36.8 C)  Resp 14  Ht 5' 10.5" (1.791 m)  Wt 178 lb 9.6 oz (81.012 kg)  BMI 25.26 kg/m2  SpO2 99%  He looks well, puffiness of the eyes  Neck: The thyroid is enlarged about 3-4 times normal on the right, significantly soft and about twice normal on the left. 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 May consider taking this every other day or even a low dose of PTU if his free T4 level continues to be relatively low Will hold off on I-131 treatment for the moment  [facility],Telia Amundson 01/15/2015, 3:31 PM

## 2015-01-15 NOTE — Patient Instructions (Signed)
Take 1/2 pill daily 

## 2015-01-16 ENCOUNTER — Encounter (HOSPITAL_COMMUNITY): Payer: PRIVATE HEALTH INSURANCE

## 2015-01-16 ENCOUNTER — Ambulatory Visit (HOSPITAL_COMMUNITY): Payer: PRIVATE HEALTH INSURANCE

## 2015-01-17 ENCOUNTER — Encounter (HOSPITAL_COMMUNITY): Payer: PRIVATE HEALTH INSURANCE

## 2015-02-14 ENCOUNTER — Other Ambulatory Visit (INDEPENDENT_AMBULATORY_CARE_PROVIDER_SITE_OTHER): Payer: PRIVATE HEALTH INSURANCE

## 2015-02-14 DIAGNOSIS — E0590 Thyrotoxicosis, unspecified without thyrotoxic crisis or storm: Secondary | ICD-10-CM

## 2015-02-14 LAB — T4, FREE: FREE T4: 1.2 ng/dL (ref 0.60–1.60)

## 2015-02-14 LAB — TSH: TSH: 0.02 u[IU]/mL — AB (ref 0.35–4.50)

## 2015-02-17 ENCOUNTER — Ambulatory Visit (INDEPENDENT_AMBULATORY_CARE_PROVIDER_SITE_OTHER): Payer: PRIVATE HEALTH INSURANCE | Admitting: Endocrinology

## 2015-02-17 ENCOUNTER — Encounter: Payer: Self-pay | Admitting: Endocrinology

## 2015-02-17 VITALS — BP 136/88 | HR 69 | Temp 98.5°F | Resp 14 | Ht 70.5 in | Wt 172.4 lb

## 2015-02-17 DIAGNOSIS — E0590 Thyrotoxicosis, unspecified without thyrotoxic crisis or storm: Secondary | ICD-10-CM

## 2015-02-17 NOTE — Progress Notes (Signed)
Patient ID: Greg Patrick, male   DOB: Jul 30, 1980, 35 y.o.   MRN: [id number]                                                                                                               Reason for Appointment:  Hyperthyroidism, follow-up  Referring physician: None   History of Present Illness:   Background history: Since about 2-3 years ago patient  had symptoms of shakiness, feeling excessively hot and sweaty, some palpitations and fatigue. Although he thinks he lost only 5 pounds he thinks his weight was about 170 pounds prior to his symptoms; in May his weight was recorded as 148 He was admitted to the hospital with chest pain and shortness of breath but was found to be tachycardic and hyperthyroid He was started on methimazole in 05/2014 at the hospital but he was finally seen in follow-up in July  Recent history: His methimazole was restarted in 10/16 when he again had a marked increase in free T4 level, previously had become hypothyroid with 15 mg methimazole  However because of a low free T4 and 20 mg methimazole he was told to reduce the dose to 10 mg in 11/16 Again in 12/16 his free T4 was decreased further  He is now taking only half a tablet of the 10 mg methimazole since 01/16/15  He says he feels fairly good without any unusual fatigue  His weight has gone back down Does not complain of any palpitations, shakiness or heat or cold intolerance Also not having  leg cramps now   Wt Readings from Last 3 Encounters:  02/17/15 172 lb 6.4 oz (78.2 kg)  01/15/15 178 lb 9.6 oz (81.012 kg)  11/15/14 168 lb 3.2 oz (76.295 kg)      Lab Results  Component Value Date   TSH 0.02* 02/14/2015   TSH 0.04* 12/26/2014   TSH 0.01* 10/08/2014   FREET4 1.20 02/14/2015   FREET4 0.30* 12/24/2014   FREET4 0.52* 11/12/2014        Medication List       This list is accurate as of: 02/17/15  3:42 PM.  Always use your most recent med list.               aspirin EC 81 MG tablet   Take 1 tablet (81 mg total) by mouth daily.     methimazole 10 MG tablet  Commonly known as:  TAPAZOLE  Take 1 tablet (10 mg total) by mouth 2 (two) times daily.            Past Medical History  Diagnosis Date  . Asthma   . Pancreatitis   . Thyroid disease     Hyperthyroid secondary to goiter    No past surgical history on file.  Family History  Problem Relation Age of Onset  . Deep vein thrombosis Mother   . Hypertension Father     Social History:  reports that he has been smoking Cigarettes.  He has never used smokeless tobacco. He reports that  he drinks alcohol. He reports that he does not use illicit drugs.  Allergies:  Allergies  Allergen Reactions  . Bee Venom Anaphylaxis  . Fish-Derived Products Anaphylaxis    Review of Systems:         Examination:   BP 136/88 mmHg  Pulse 69  Temp(Src) 98.5 F (36.9 C)  Resp 14  Ht 5' 10.5" (1.791 m)  Wt 172 lb 6.4 oz (78.2 kg)  BMI 24.38 kg/m2  SpO2 97%  He looks well, puffiness of the eyes  Neck: The thyroid is enlarged about 3 times normal on the right, soft mostly and about twice normal on the left. 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

## 2015-02-17 NOTE — Patient Instructions (Signed)
No change 

## 2015-03-27 ENCOUNTER — Other Ambulatory Visit (INDEPENDENT_AMBULATORY_CARE_PROVIDER_SITE_OTHER): Payer: PRIVATE HEALTH INSURANCE

## 2015-03-27 DIAGNOSIS — E0590 Thyrotoxicosis, unspecified without thyrotoxic crisis or storm: Secondary | ICD-10-CM | POA: Diagnosis not present

## 2015-03-28 LAB — TSH: TSH: 0.03 u[IU]/mL — AB (ref 0.35–4.50)

## 2015-03-28 LAB — T4, FREE: Free T4: 1.09 ng/dL (ref 0.60–1.60)

## 2015-03-28 LAB — T3, FREE: T3 FREE: 4.2 pg/mL (ref 2.3–4.2)

## 2015-03-31 ENCOUNTER — Other Ambulatory Visit: Payer: Self-pay | Admitting: Endocrinology

## 2015-03-31 ENCOUNTER — Ambulatory Visit (INDEPENDENT_AMBULATORY_CARE_PROVIDER_SITE_OTHER): Payer: PRIVATE HEALTH INSURANCE | Admitting: Endocrinology

## 2015-03-31 VITALS — BP 124/76 | HR 95 | Temp 98.8°F | Resp 14 | Ht 70.0 in | Wt 174.6 lb

## 2015-03-31 DIAGNOSIS — E0590 Thyrotoxicosis, unspecified without thyrotoxic crisis or storm: Secondary | ICD-10-CM | POA: Diagnosis not present

## 2015-03-31 NOTE — Patient Instructions (Signed)
5 mg alternate with 7.5mg 

## 2015-03-31 NOTE — Progress Notes (Signed)
Patient ID: Greg Patrick, male   DOB: Aug 27, 1980, 35 y.o.   MRN: [id number]                                                                                                               Reason for Appointment:  Hyperthyroidism, follow-up  Referring physician: None   History of Present Illness:   Background history: Since about 2-3 years ago patient  had symptoms of shakiness, feeling excessively hot and sweaty, some palpitations and fatigue. Although he thinks he lost only 5 pounds he thinks his weight was about 170 pounds prior to his symptoms; in May his weight was recorded as 148 He was admitted to the hospital with chest pain and shortness of breath but was found to be tachycardic and hyperthyroid He was started on methimazole in 05/2014 at the hospital but he was finally seen in follow-up in July  Recent history: His methimazole was restarted in 10/16 when he again had a marked increase in free T4 level, previously had become hypothyroid with 15 mg methimazole   However because of a low free T4 and 20 mg methimazole he was told to reduce the dose to 10 mg in 11/16 Again in 12/16 his free T4 was decreased further  He is taking only half a tablet of the 10 mg methimazole since 01/16/15  He says he feels fairly good without any unusual fatigue  Does not complain of any palpitations, shakiness or heat or cold intolerance His weight is about the same  Labs show stable free T4 but free T3 is high normal  Wt Readings from Last 3 Encounters:  03/31/15 174 lb 9.6 oz (79.198 kg)  02/17/15 172 lb 6.4 oz (78.2 kg)  01/15/15 178 lb 9.6 oz (81.012 kg)      Lab Results  Component Value Date   TSH 0.03* 03/27/2015   TSH 0.02* 02/14/2015   TSH 0.04* 12/26/2014   FREET4 1.09 03/27/2015   FREET4 1.20 02/14/2015   FREET4 0.30* 12/24/2014        Medication List       This list is accurate as of: 03/31/15 11:59 PM.  Always use your most recent med list.               aspirin  EC 81 MG tablet  Take 1 tablet (81 mg total) by mouth daily.     methimazole 10 MG tablet  Commonly known as:  TAPAZOLE  Take 1 tablet (10 mg total) by mouth 2 (two) times daily.     methimazole 10 MG tablet  Commonly known as:  TAPAZOLE  TAKE ONE TABLET BY MOUTH IN THE MORNING AND ONE-HALF IN THE EVENING            Past Medical History  Diagnosis Date  . Asthma   . Pancreatitis   . Thyroid disease     Hyperthyroid secondary to goiter    No past surgical history on file.  Family History  Problem Relation Age of Onset  . Deep vein  thrombosis Mother   . Hypertension Father     Social History:  reports that he has been smoking Cigarettes.  He has never used smokeless tobacco. He reports that he drinks alcohol. He reports that he does not use illicit drugs.  Allergies:  Allergies  Allergen Reactions  . Bee Venom Anaphylaxis  . Fish-Derived Products Anaphylaxis    Review of Systems:         Examination:   BP 124/76 mmHg  Pulse 95  Temp(Src) 98.8 F (37.1 C)  Resp 14  Ht 5\' 10"  (1.778 m)  Wt 174 lb 9.6 oz (79.198 kg)  BMI 25.05 kg/m2  SpO2 97%  Gen. appearance normal Neck: The thyroid is enlarged about 3 times normal on the right, relatively soft and about twice normal on the left. 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  With taking only 5 mg  methimazole he is euthyroid and clinically doing well However his free T3 is upper normal Otherwise free T4 has been stable and slightly lower TSH is suppressed as expected  Still continues to have a large goiter  Discussed that since he is only on small dose of methimazole it may be was able to get him into remission with low-dose methimazole alone  For now since his T3 level is upper normal will increase his  methimazole to 5 mg alternating with 7.5 Will check TSI level on his next visit to decide whether he may need I-131 treatment     Greg Patrick 04/01/2015, 8:59 AM

## 2015-04-07 ENCOUNTER — Telehealth: Payer: Self-pay | Admitting: Endocrinology

## 2015-04-07 ENCOUNTER — Other Ambulatory Visit: Payer: Self-pay | Admitting: *Deleted

## 2015-04-07 MED ORDER — METHIMAZOLE 10 MG PO TABS: ORAL_TABLET | ORAL | Status: DC

## 2015-04-07 NOTE — Telephone Encounter (Signed)
Rx was sent in on 03/27, resent again today.

## 2015-04-07 NOTE — Telephone Encounter (Signed)
[facility]

## 2015-05-12 ENCOUNTER — Other Ambulatory Visit (INDEPENDENT_AMBULATORY_CARE_PROVIDER_SITE_OTHER): Payer: PRIVATE HEALTH INSURANCE

## 2015-05-12 DIAGNOSIS — E0590 Thyrotoxicosis, unspecified without thyrotoxic crisis or storm: Secondary | ICD-10-CM

## 2015-05-12 LAB — T3, FREE: T3, Free: 4 pg/mL (ref 2.3–4.2)

## 2015-05-12 LAB — T4, FREE: FREE T4: 0.88 ng/dL (ref 0.60–1.60)

## 2015-05-12 LAB — TSH: TSH: 0.01 u[IU]/mL — ABNORMAL LOW (ref 0.35–4.50)

## 2015-05-15 ENCOUNTER — Ambulatory Visit (INDEPENDENT_AMBULATORY_CARE_PROVIDER_SITE_OTHER): Payer: PRIVATE HEALTH INSURANCE | Admitting: Endocrinology

## 2015-05-15 ENCOUNTER — Other Ambulatory Visit: Payer: Self-pay | Admitting: *Deleted

## 2015-05-15 ENCOUNTER — Encounter: Payer: Self-pay | Admitting: Endocrinology

## 2015-05-15 VITALS — BP 134/84 | HR 77 | Temp 98.7°F | Resp 14 | Ht 70.0 in | Wt 177.4 lb

## 2015-05-15 DIAGNOSIS — E0590 Thyrotoxicosis, unspecified without thyrotoxic crisis or storm: Secondary | ICD-10-CM | POA: Diagnosis not present

## 2015-05-15 MED ORDER — METHIMAZOLE 10 MG PO TABS: ORAL_TABLET | ORAL | Status: DC

## 2015-05-15 NOTE — Patient Instructions (Signed)
Stop Rx 1 week prior to the uptake test then start back 1 pill daily, 3 days after Rx dose of iodine

## 2015-05-15 NOTE — Progress Notes (Signed)
Patient ID: Greg Patrick, male   DOB: 27-Dec-1980, 35 y.o.   MRN: [id number]                                                                                                               Reason for Appointment:  Hyperthyroidism, follow-up  Referring physician: None   History of Present Illness:   Background history: Since about 2-3 years ago patient  had symptoms of shakiness, feeling excessively hot and sweaty, some palpitations and fatigue. Although he thinks he lost only 5 pounds he thinks his weight was about 170 pounds prior to his symptoms; in May his weight was recorded as 148 He was admitted to the hospital with chest pain and shortness of breath but was found to be tachycardic and hyperthyroid He was started on methimazole in 05/2014 at the hospital but he was finally seen in follow-up in July  Recent history: His methimazole was restarted in 10/16 when he again had a marked increase in free T4 level, previously had become hypothyroid with 15 mg methimazole   However because of a low free T4 and 20 mg methimazole he was told to reduce the dose to 10 mg in 11/16 Again in 12/16 his free T4 was decreased further  He says he feels fairly good without any unusual fatigue  Does not complain of any palpitations, shakiness or heat or cold intolerance His weight is about the same  Although on his last visit he was told to alternate 10 mg with 5 mg they misunderstood and is taking 15 mg a day now  Labs show relatively lower free T4 but free T3 is high normal  Wt Readings from Last 3 Encounters:  05/15/15 177 lb 6.4 oz (80.468 kg)  03/31/15 174 lb 9.6 oz (79.198 kg)  02/17/15 172 lb 6.4 oz (78.2 kg)      Lab Results  Component Value Date   TSH 0.01* 05/12/2015   TSH 0.03* 03/27/2015   TSH 0.02* 02/14/2015   FREET4 0.88 05/12/2015   FREET4 1.09 03/27/2015   FREET4 1.20 02/14/2015   Lab Results  Component Value Date   T3FREE 4.0 05/12/2015   T3FREE 4.2 03/27/2015   T3FREE 4.0 07/31/2014        Medication List       This list is accurate as of: 05/15/15  3:05 PM.  Always use your most recent med list.               aspirin EC 81 MG tablet  Take 1 tablet (81 mg total) by mouth daily.     methimazole 10 MG tablet  Commonly known as:  TAPAZOLE  TAKE ONE TABLET BY MOUTH IN THE MORNING AND ONE-HALF IN THE EVENING            Past Medical History  Diagnosis Date  . Asthma   . Pancreatitis   . Thyroid disease     Hyperthyroid secondary to goiter    No past surgical history on file.  Family History  Problem  Relation Age of Onset  . Deep vein thrombosis Mother   . Hypertension Father     Social History:  reports that he has been smoking Cigarettes.  He has never used smokeless tobacco. He reports that he drinks alcohol. He reports that he does not use illicit drugs.  Allergies:  Allergies  Allergen Reactions  . Bee Venom Anaphylaxis  . Fish-Derived Products Anaphylaxis    Review of Systems:         Examination:   BP 134/84 mmHg  Pulse 77  Temp(Src) 98.7 F (37.1 C)  Resp 14  Ht 5\' 10"  (1.778 m)  Wt 177 lb 6.4 oz (80.468 kg)  BMI 25.45 kg/m2  SpO2 95%  He looks well Neck: The thyroid is enlarged about 3 times normal on the right, relatively soft and about twice normal on the left. 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   Has been given information on this and directions on holding methimazole were reviewed again  He will restart 10 mg methimazole after 3 days of getting the I-131   [facility],Chancy Claros 05/15/2015, 3:05 PM   Note: This office note was prepared with Insurance underwriterDragon voice recognition system technology. Any transcriptional errors that result from this process are unintentional.

## 2015-06-05 ENCOUNTER — Other Ambulatory Visit: Payer: Self-pay | Admitting: *Deleted

## 2015-06-05 ENCOUNTER — Telehealth: Payer: Self-pay | Admitting: Endocrinology

## 2015-06-05 MED ORDER — METHIMAZOLE 10 MG PO TABS: ORAL_TABLET | ORAL | Status: DC

## 2015-06-05 NOTE — Telephone Encounter (Signed)
PT called back and [facility] [place]

## 2015-06-05 NOTE — Telephone Encounter (Signed)
Pt needs refill on thyroid med please call into [facility]

## 2015-06-05 NOTE — Telephone Encounter (Signed)
Rx sent 

## 2015-06-06 ENCOUNTER — Other Ambulatory Visit: Payer: Self-pay | Admitting: *Deleted

## 2015-06-06 MED ORDER — METHIMAZOLE 10 MG PO TABS: ORAL_TABLET | ORAL | Status: DC

## 2015-06-06 NOTE — Telephone Encounter (Signed)
Rx sent 

## 2015-06-23 ENCOUNTER — Telehealth: Payer: Self-pay | Admitting: Endocrinology

## 2015-06-23 ENCOUNTER — Other Ambulatory Visit (INDEPENDENT_AMBULATORY_CARE_PROVIDER_SITE_OTHER): Payer: Self-pay

## 2015-06-23 DIAGNOSIS — E0590 Thyrotoxicosis, unspecified without thyrotoxic crisis or storm: Secondary | ICD-10-CM

## 2015-06-23 LAB — T3, FREE: T3 FREE: 2.9 pg/mL (ref 2.3–4.2)

## 2015-06-23 LAB — T4, FREE: FREE T4: 0.51 ng/dL — AB (ref 0.60–1.60)

## 2015-06-23 NOTE — Telephone Encounter (Signed)
Documented created erroneously

## 2015-06-24 LAB — THYROTROPIN RECEPTOR AUTOABS: THYROTROPIN RECEPTOR AB: 2.13 IU/L — AB (ref 0.00–1.75)

## 2015-06-26 ENCOUNTER — Ambulatory Visit: Payer: PRIVATE HEALTH INSURANCE | Admitting: Endocrinology

## 2015-07-21 ENCOUNTER — Telehealth: Payer: Self-pay | Admitting: Endocrinology

## 2015-07-21 MED ORDER — METHIMAZOLE 10 MG PO TABS: ORAL_TABLET | ORAL | Status: DC

## 2015-07-21 NOTE — Telephone Encounter (Signed)
Pt needs tapazole called into [facility]

## 2015-07-21 NOTE — Telephone Encounter (Signed)
Rx submitted per pt's request.  

## 2015-08-04 ENCOUNTER — Encounter: Payer: Self-pay | Admitting: Endocrinology

## 2015-08-04 ENCOUNTER — Ambulatory Visit (INDEPENDENT_AMBULATORY_CARE_PROVIDER_SITE_OTHER): Payer: Self-pay | Admitting: Endocrinology

## 2015-08-04 VITALS — BP 124/86 | HR 81 | Ht 70.0 in | Wt 179.0 lb

## 2015-08-04 DIAGNOSIS — E0590 Thyrotoxicosis, unspecified without thyrotoxic crisis or storm: Secondary | ICD-10-CM

## 2015-08-04 LAB — T4, FREE: FREE T4: 0.63 ng/dL (ref 0.60–1.60)

## 2015-08-04 LAB — T3, FREE: T3 FREE: 3.9 pg/mL (ref 2.3–4.2)

## 2015-08-04 LAB — TSH: TSH: 0.1 u[IU]/mL — ABNORMAL LOW (ref 0.35–4.50)

## 2015-08-04 NOTE — Progress Notes (Signed)
Patient ID: Greg Patrick, male   DOB: 1980-08-09, 35 y.o.   MRN: [id number]                                                                                                               Reason for Appointment:  Hyperthyroidism, follow-up  Referring physician: None   History of Present Illness:   Background history: Since about 2-3 years ago patient  had symptoms of shakiness, feeling excessively hot and sweaty, some palpitations and fatigue. Although he thinks he lost only 5 pounds he thinks his weight was about 170 pounds prior to his symptoms; in May his weight was recorded as 148 He was admitted to the hospital with chest pain and shortness of breath but was found to be tachycardic and hyperthyroid He was started on methimazole in 05/2014 at the hospital but he was finally seen in follow-up in July  Recent history: His methimazole was restarted in 10/16 when he again had a marked increase in free T4 level, previously had become hypothyroid with 15 mg methimazole  He was able to gradually reduce his dose subsequently On a previous visit he was told to alternate 10 mg with 5 mg but he misunderstood and was taking 15 mg Despite this his thyroid levels were not relatively low  He was advised radioactive iodine treatment because of lack of remission of his Graves' disease but he missed his last appointment   In 6/70 and his free T4 had gone down below normal and his methimazole was reduced to 10 mg Thyrotropin receptor antibody in 6/17 and was only mildly increased at  2.1  He says he feels fairly good without any unusual fatigue  Does not complain of any palpitations, shakiness or heat or cold intolerance His weight is about the same  Labs show relatively lower free T4 but free T3 is high normal  Wt Readings from Last 3 Encounters:  08/04/15 179 lb (81.2 kg)  05/15/15 177 lb 6.4 oz (80.5 kg)  03/31/15 174 lb 9.6 oz (79.2 kg)      Lab Results  Component Value Date   TSH 0.01  (L) 05/12/2015   TSH 0.03 (L) 03/27/2015   TSH 0.02 (L) 02/14/2015   FREET4 0.51 (L) 06/23/2015   FREET4 0.88 05/12/2015   FREET4 1.09 03/27/2015   Lab Results  Component Value Date   T3FREE 2.9 06/23/2015   T3FREE 4.0 05/12/2015   T3FREE 4.2 03/27/2015        Medication List       Accurate as of 08/04/15  1:56 PM. Always use your most recent med list.          aspirin EC 81 MG tablet Take 1 tablet (81 mg total) by mouth daily.   methimazole 10 MG tablet Commonly known as:  TAPAZOLE TAKE ONE TABLET BY MOUTH IN THE MORNING           Past Medical History:  Diagnosis Date  . Asthma   . Pancreatitis   . 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  Family History  Problem Relation Age of Onset  . Deep vein thrombosis Mother   . Hypertension Father     Social History:  reports that he has been smoking Cigarettes.  He has never used smokeless tobacco. He reports that he drinks alcohol. He reports that he does not use drugs.  Allergies:  Allergies  Allergen Reactions  . Bee Venom Anaphylaxis  . Fish-Derived Products Anaphylaxis    Review of Systems:         Examination:   BP 124/86 (BP Location: Left Arm, Patient Position: Sitting, Cuff Size: Normal)   Pulse 81   Ht 5\' 10"  (1.778 m)   Wt 179 lb (81.2 kg)   SpO2 97%   BMI 25.68 kg/m   He looks well His eyes looked normal Neck: The thyroid is enlarged about 3 times normal on the right, relatively soft and about twice normal on the left. 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 He has had variable doses of methimazole and although he was supposed to get his I-131 uptake in preparation for treatment his thyroid levels came out relatively low in 6/17 and also his thyrotropin  antibodies only minimally increased  With taking 10 mg methimazole his thyroid level needs to be reassessed Still has a large goiter although it feels relatively smaller than before  Will decide on his methimazole dosage based on today's Labs Also may need to recheck his thyrotropin receptor antibody on the next visit for prognostication  [facility] 08/04/2015, 1:56 PM   Note: This office note was prepared with Dragon voice recognition system technology. Any transcriptional errors that result from this process are unintentional.

## 2015-08-04 NOTE — Progress Notes (Signed)
Please let patient know that the lab result is low normal  He can take 1 tablet methimazole one day and half tablet the next until the next visit

## 2015-08-06 ENCOUNTER — Telehealth: Payer: Self-pay

## 2015-08-06 NOTE — Telephone Encounter (Signed)
Called patient about lab results, and advised of medication changes. Advised patient to call back if any questions.

## 2015-08-15 ENCOUNTER — Ambulatory Visit: Payer: PRIVATE HEALTH INSURANCE

## 2015-08-18 ENCOUNTER — Encounter (HOSPITAL_COMMUNITY): Payer: Self-pay | Admitting: Emergency Medicine

## 2015-08-18 ENCOUNTER — Ambulatory Visit (HOSPITAL_COMMUNITY)
Admission: EM | Admit: 2015-08-18 | Discharge: 2015-08-18 | Disposition: A | Discharge status: Home / Self Care | Payer: Self-pay | Attending: Family Medicine | Admitting: Family Medicine

## 2015-08-18 DIAGNOSIS — J0101 Acute recurrent maxillary sinusitis: Secondary | ICD-10-CM

## 2015-08-18 MED ORDER — AMOXICILLIN 875 MG PO TABS: 875.0000 mg | ORAL_TABLET | Freq: Two times a day (BID) | ORAL | 0 refills | Status: DC

## 2015-08-18 MED ORDER — METHYLPREDNISOLONE 4 MG PO TBPK: ORAL_TABLET | ORAL | 0 refills | Status: DC

## 2015-08-18 NOTE — ED Provider Notes (Signed)
CSN: [id number]     Arrival date & time 08/18/15  1456 History   First MD Initiated Contact with Patient 08/18/15 1556     Chief Complaint  Patient presents with  . URI   (Consider location/radiation/quality/duration/timing/severity/associated sxs/prior Treatment) Patient having facial pain and discomfort and has halotosis and mucopurlent sinus drainage.   The history is provided by the patient.  URI  Presenting symptoms: congestion, facial pain, fatigue and fever   Severity:  Moderate Onset quality:  Gradual Duration:  2 weeks Timing:  Constant Progression:  Worsening Chronicity:  New Relieved by:  Nothing Worsened by:  Nothing   Past Medical History:  Diagnosis Date  . Asthma   . Pancreatitis   . 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 Family History  Problem Relation Age of Onset  . Deep vein thrombosis Mother   . Hypertension Father    Social History  Substance Use Topics  . Smoking status: Current Every Day Smoker    Types: Cigarettes  . Smokeless tobacco: Never Used  . Alcohol use 0.0 oz/week     Comment: Socially    Review of Systems  Constitutional: Positive for fatigue and fever.  HENT: Positive for congestion.   Eyes: Negative.   Respiratory: Negative.   Cardiovascular: Negative.   Gastrointestinal: Negative.   Endocrine: Negative.   Genitourinary: Negative.   Musculoskeletal: Negative.   Skin: Negative.   Allergic/Immunologic: Negative.   Neurological: Negative.   Hematological: Negative.   Psychiatric/Behavioral: Negative.     Allergies  Bee venom and Fish-derived products  Home Medications   Prior to Admission medications   Medication Sig Start Date End Date Taking? Authorizing Provider  amoxicillin (AMOXIL) 875 MG tablet Take 1 tablet (875 mg total) by mouth 2 (two) times daily. 08/18/15   Deatra CanterWilliam J Oxford, FNP  aspirin EC 81 MG tablet Take 1 tablet (81 mg total) by mouth daily. 05/26/14    Nishant Dhungel, MD  methimazole (TAPAZOLE) 10 MG tablet TAKE ONE TABLET BY MOUTH IN THE MORNING 07/21/15   Reather LittlerAjay Kumar, MD  methylPREDNISolone (MEDROL DOSEPAK) 4 MG TBPK tablet Take 6-5-4-3-2-1 po qd 08/18/15   Deatra CanterWilliam J Oxford, FNP   Meds Ordered and Administered this Visit  Medications - No data to display  BP 134/97 (BP Location: Right Arm)   Pulse 65   Temp 98.5 F (36.9 C) (Oral)   Resp 12   SpO2 98%  No data found.   Physical Exam  Urgent Care Course   Clinical Course    Procedures (including critical care time)  Labs Review Labs Reviewed - No data to display  Imaging Review No results found.   Visual Acuity Review  Right Eye Distance:   Left Eye Distance:   Bilateral Distance:    Right Eye Near:   Left Eye Near:    Bilateral Near:         MDM   1. Acute recurrent maxillary sinusitis     Amoxicillin 875mg  one po bid x 10 days #20 Medrol dose pack 4mg  #21   Deatra CanterWilliam J Oxford, FNP 08/18/15 2122

## 2015-08-18 NOTE — ED Triage Notes (Signed)
Sinus pressure, and pain.  Symptoms for a week.  Patient has had runny nose, stuffy ear, symptoms particularly on right side of head.  Denies fever

## 2015-09-10 ENCOUNTER — Other Ambulatory Visit (INDEPENDENT_AMBULATORY_CARE_PROVIDER_SITE_OTHER): Payer: Self-pay

## 2015-09-10 DIAGNOSIS — E0590 Thyrotoxicosis, unspecified without thyrotoxic crisis or storm: Secondary | ICD-10-CM

## 2015-09-10 LAB — TSH: TSH: 0.01 u[IU]/mL — AB (ref 0.35–4.50)

## 2015-09-10 LAB — T4, FREE: FREE T4: 1.01 ng/dL (ref 0.60–1.60)

## 2015-09-11 LAB — THYROTROPIN RECEPTOR AUTOABS: Thyrotropin Receptor Ab: 1.41 IU/L (ref 0.00–1.75)

## 2015-09-15 ENCOUNTER — Ambulatory Visit: Payer: Self-pay | Admitting: Endocrinology

## 2015-10-08 ENCOUNTER — Ambulatory Visit (INDEPENDENT_AMBULATORY_CARE_PROVIDER_SITE_OTHER): Payer: Self-pay | Admitting: Endocrinology

## 2015-10-08 ENCOUNTER — Encounter: Payer: Self-pay | Admitting: Endocrinology

## 2015-10-08 VITALS — BP 128/82 | Ht 71.0 in | Wt 178.0 lb

## 2015-10-08 DIAGNOSIS — E0590 Thyrotoxicosis, unspecified without thyrotoxic crisis or storm: Secondary | ICD-10-CM

## 2015-10-08 LAB — TSH: TSH: 0.02 u[IU]/mL — AB (ref 0.35–4.50)

## 2015-10-08 LAB — T4, FREE: Free T4: 0.82 ng/dL (ref 0.60–1.60)

## 2015-10-08 NOTE — Progress Notes (Signed)
Patient ID: Greg Patrick, male   DOB: July 05, 1980, 35 y.o.   MRN: [id number]                                                                                                               Reason for Appointment:  Hyperthyroidism, follow-up  Referring physician: None   History of Present Illness:   Background history: Since about 2-3 years ago patient  had symptoms of shakiness, feeling excessively hot and sweaty, some palpitations and fatigue. Although he thinks he lost only 5 pounds he thinks his weight was about 170 pounds prior to his symptoms; in May his weight was recorded as 148 He was admitted to the hospital with chest pain and shortness of breath but was found to be tachycardic and hyperthyroid He was started on methimazole in 05/2014 at the hospital but he was finally seen in follow-up in July  Recent history: His methimazole was restarted in 10/16 when he again had a marked increase in free T4 level, previously had become hypothyroid with 15 mg methimazole  He was able to gradually reduce his dose subsequently  In 6/17  his free T4 had gone down below normal and his methimazole was reduced to 10 mg Subsequently this was reduced to 10 mg alternating with 5 mg in 7/17  Thyrotropin receptor antibody in 6/17 and was only mildly increased at  2.1 and is now normal  He says he feels fairly good without any unusual fatigue  Does not complain of any palpitations, shakiness or heat or cold intolerance His weight is about the same  Labs show relatively lower free T4 but free T3 is high normal  Wt Readings from Last 3 Encounters:  10/08/15 178 lb (80.7 kg)  08/04/15 179 lb (81.2 kg)  05/15/15 177 lb 6.4 oz (80.5 kg)      Lab Results  Component Value Date   TSH 0.01 (L) 09/10/2015   TSH 0.10 (L) 08/04/2015   TSH 0.01 (L) 05/12/2015   FREET4 1.01 09/10/2015   FREET4 0.63 08/04/2015   FREET4 0.51 (L) 06/23/2015   Lab Results  Component Value Date   T3FREE 3.9 08/04/2015     T3FREE 2.9 06/23/2015   T3FREE 4.0 05/12/2015        Medication List       Accurate as of 10/08/15  2:08 PM. Always use your most recent med list.          amoxicillin 875 MG tablet Commonly known as:  AMOXIL Take 1 tablet (875 mg total) by mouth 2 (two) times daily.   aspirin EC 81 MG tablet Take 1 tablet (81 mg total) by mouth daily.   methimazole 10 MG tablet Commonly known as:  TAPAZOLE TAKE ONE TABLET BY MOUTH IN THE MORNING   methylPREDNISolone 4 MG Tbpk tablet Commonly known as:  MEDROL DOSEPAK Take 6-5-4-3-2-1 po qd           Past Medical History:  Diagnosis Date  . Asthma   . Pancreatitis   . 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  Family History  Problem Relation Age of Onset  . Deep vein thrombosis Mother   . Hypertension Father     Social History:  reports that he has been smoking Cigarettes.  He has never used smokeless tobacco. He reports that he drinks alcohol. He reports that he does not use drugs.  Allergies:  Allergies  Allergen Reactions  . Bee Venom Anaphylaxis  . Fish-Derived Products Anaphylaxis    Review of Systems:         Examination:   BP 128/82   Ht 5\' 11"  (1.803 m)   Wt 178 lb (80.7 kg)   BMI 24.83 kg/m   He looks well His eyes looked normal Neck: The thyroid is enlarged about 3 times normal on the right, relatively soft and about 1.5-2 times normal on the left. 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 He has had variable doses of methimazole and more recently has been able to taper the dose down  Thyrotropin antibodies now in the normal range and improved Although he had normal levels with taking the equivalent of 7.5 mg methimazole last month he needs follow-up test today  His goiter is  significantly smaller than previously  Will decide on his methimazole dosage based on today's Labs May be able to taper off the methimazole fairly quickly now  [facility],Kastin Cerda 10/08/2015, 2:08 PM   Note: This office note was prepared with Insurance underwriterDragon voice recognition system technology. Any transcriptional errors that result from this process are unintentional.

## 2015-11-17 ENCOUNTER — Other Ambulatory Visit (INDEPENDENT_AMBULATORY_CARE_PROVIDER_SITE_OTHER): Payer: Self-pay

## 2015-11-17 DIAGNOSIS — E0590 Thyrotoxicosis, unspecified without thyrotoxic crisis or storm: Secondary | ICD-10-CM

## 2015-11-17 LAB — TSH: TSH: 0.01 u[IU]/mL — ABNORMAL LOW (ref 0.35–4.50)

## 2015-11-17 LAB — T4, FREE: Free T4: 2.28 ng/dL — ABNORMAL HIGH (ref 0.60–1.60)

## 2015-11-19 ENCOUNTER — Ambulatory Visit: Payer: Self-pay | Admitting: Endocrinology

## 2015-11-21 ENCOUNTER — Ambulatory Visit (INDEPENDENT_AMBULATORY_CARE_PROVIDER_SITE_OTHER): Payer: Self-pay | Admitting: Endocrinology

## 2015-11-21 ENCOUNTER — Encounter: Payer: Self-pay | Admitting: Endocrinology

## 2015-11-21 VITALS — BP 142/82 | Wt 177.0 lb

## 2015-11-21 DIAGNOSIS — E0590 Thyrotoxicosis, unspecified without thyrotoxic crisis or storm: Secondary | ICD-10-CM

## 2015-11-21 NOTE — Patient Instructions (Signed)
3 days after getting the actual radioactive iodine treatment started back on methimazole 1 tablet daily If having excessive shakiness of palpitations please call and we will send in prescription for metoprolol

## 2015-11-21 NOTE — Progress Notes (Signed)
Patient ID: Greg Patrick, male   DOB: [birth year], 35 y.o.   MRN: [id number]                                                                                                               Reason for Appointment:  Hyperthyroidism, follow-up  Referring physician: None   History of Present Illness:   Background history: Since about 2-3 years ago patient  had symptoms of shakiness, feeling excessively hot and sweaty, some palpitations and fatigue. Although he thinks he lost only 5 pounds he thinks his weight was about 170 pounds prior to his symptoms; in May his weight was recorded as 148 He was admitted to the hospital with chest pain and shortness of breath but was found to be tachycardic and hyperthyroid He was started on methimazole in 05/2014 at the hospital but he was finally seen in follow-up in July  Recent history: His methimazole was restarted in 10/16 when he again had a marked increase in free T4 level, previously had become hypothyroid with 15 mg methimazole  He was able to gradually reduce his dose subsequently In 6/17  his free T4 had gone down below normal and his methimazole was reduced to 10 mg Subsequently this was reduced to 10 mg alternating with 5 mg in 7/17 and then to 5 mg every other day in 10/17  Thyrotropin receptor antibody in 6/17 and was only mildly increased at  2.1 and is as of 9/17 normal  He says he feels fairly good without any unusual fatigue  Does not complain of any palpitations, or heat or cold intolerance May have minimal shakiness His weight is about the same  Labs show relatively lower free T4 but free T3 is high normal  Wt Readings from Last 3 Encounters:  11/21/15 177 lb (80.3 kg)  10/08/15 178 lb (80.7 kg)  08/04/15 179 lb (81.2 kg)    Free T4 now is significantly higher  Lab Results  Component Value Date   TSH 0.01 (L) 11/17/2015   TSH 0.02 (L) 10/08/2015   TSH 0.01 (L) 09/10/2015   FREET4 2.28 (H) 11/17/2015   FREET4 0.82  10/08/2015   FREET4 1.01 09/10/2015   Lab Results  Component Value Date   T3FREE 3.9 08/04/2015   T3FREE 2.9 06/23/2015   T3FREE 4.0 05/12/2015        Medication List       Accurate as of 11/21/15  2:06 PM. Always use your most recent med list.          aspirin EC 81 MG tablet Take 1 tablet (81 mg total) by mouth daily.   methimazole 10 MG tablet Commonly known as:  TAPAZOLE TAKE ONE TABLET BY MOUTH IN THE MORNING           Past Medical History:  Diagnosis Date  . Asthma   . Pancreatitis   . 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  Family History  Problem Relation Age  of Onset  . Deep vein thrombosis Mother   . Hypertension Father     Social History:  reports that he has been smoking Cigarettes.  He has never used smokeless tobacco. He reports that he drinks alcohol. He reports that he does not use drugs.  Allergies:  Allergies  Allergen Reactions  . Bee Venom Anaphylaxis  . Fish-Derived Products Anaphylaxis    Review of Systems:         Examination:   BP (!) 142/82   Wt 177 lb (80.3 kg)   SpO2 98%   BMI 24.69 kg/m   He looks well His eyes Externally appear normal Neck: The thyroid is enlarged about 3 times normal on the right, relatively soft and about 1.5-2 times normal on the left. 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 He has had variable doses of methimazole and although more recently has been able to taper the dose down his hyperthyroid and has relapsed again This is despite his thyrotropin antibodies being normal in September Still has significant goiter  Because of the inconsistent response to methimazole will proceed with I-131 treatment now, discussed how this will be done  Patient Instructions  3 days after getting the actual  radioactive iodine treatment started back on methimazole 1 tablet daily If having excessive shakiness of palpitations please call and we will send in prescription for metoprolol    Greg Patrick 11/21/2015, 2:06 PM   Note: This office note was prepared with Dragon voice recognition system technology. Any transcriptional errors that result from this process are unintentional.

## 2015-12-04 ENCOUNTER — Telehealth: Payer: Self-pay | Admitting: Endocrinology

## 2015-12-04 NOTE — Telephone Encounter (Signed)
What surgery is he scheduled for?  He was supposed to have his I-131 uptake done this month and need to find out why it was delayed until 12/20

## 2015-12-04 NOTE — Telephone Encounter (Signed)
Delaney Meigsamara calling to let us know that he is falling asleep while he is working and is constantly lethargic now that he is off his meds due to the upcoming surgery  Please call Delaney Meigsamara [phone] page her

## 2015-12-05 NOTE — Telephone Encounter (Signed)
Called but did not get an answer

## 2015-12-10 NOTE — Telephone Encounter (Signed)
Greg Patrick Greg Patrick is scheduling today

## 2015-12-10 NOTE — Telephone Encounter (Signed)
Feeling very tired and wants to know if this is normal there is no surgery he is off levothyroxine- has to go to radiology for 2 days and that is all they told him but patient states that you told him it would be 3 days with radiology please advise

## 2015-12-10 NOTE — Telephone Encounter (Signed)
We need to get him back in for his thyroid lab work to see what his thyroid levels are, he can come today or tomorrow

## 2015-12-24 ENCOUNTER — Encounter (HOSPITAL_COMMUNITY)
Admission: RE | Admit: 2015-12-24 | Discharge: 2015-12-24 | Disposition: A | Discharge status: Home / Self Care | Payer: PRIVATE HEALTH INSURANCE | Source: Ambulatory Visit | Attending: Endocrinology | Admitting: Endocrinology

## 2015-12-24 DIAGNOSIS — E0590 Thyrotoxicosis, unspecified without thyrotoxic crisis or storm: Secondary | ICD-10-CM | POA: Insufficient documentation

## 2015-12-24 MED ORDER — SODIUM IODIDE I 131 CAPSULE
12.2000 | Freq: Once | INTRAVENOUS | Status: AC | PRN
  Administered 2015-12-24: 12.2 via ORAL

## 2015-12-25 ENCOUNTER — Encounter (HOSPITAL_COMMUNITY)
Admission: RE | Admit: 2015-12-25 | Discharge: 2015-12-25 | Disposition: A | Discharge status: Home / Self Care | Payer: PRIVATE HEALTH INSURANCE | Source: Ambulatory Visit | Attending: Endocrinology | Admitting: Endocrinology

## 2015-12-25 ENCOUNTER — Other Ambulatory Visit: Payer: Self-pay | Admitting: Endocrinology

## 2015-12-25 DIAGNOSIS — E0590 Thyrotoxicosis, unspecified without thyrotoxic crisis or storm: Secondary | ICD-10-CM

## 2016-01-09 ENCOUNTER — Other Ambulatory Visit: Payer: Self-pay

## 2016-01-14 ENCOUNTER — Ambulatory Visit: Payer: Self-pay | Admitting: Endocrinology

## 2016-01-23 ENCOUNTER — Encounter (HOSPITAL_COMMUNITY): Admission: RE | Admit: 2016-01-23 | Payer: PRIVATE HEALTH INSURANCE | Source: Ambulatory Visit

## 2016-01-26 ENCOUNTER — Other Ambulatory Visit (INDEPENDENT_AMBULATORY_CARE_PROVIDER_SITE_OTHER): Payer: PRIVATE HEALTH INSURANCE

## 2016-01-26 DIAGNOSIS — E0590 Thyrotoxicosis, unspecified without thyrotoxic crisis or storm: Secondary | ICD-10-CM | POA: Diagnosis not present

## 2016-01-27 LAB — T4, FREE: Free T4: 2.44 ng/dL — ABNORMAL HIGH (ref 0.60–1.60)

## 2016-01-29 ENCOUNTER — Ambulatory Visit: Payer: Self-pay | Admitting: Endocrinology

## 2016-01-30 ENCOUNTER — Telehealth: Payer: Self-pay | Admitting: Endocrinology

## 2016-01-30 ENCOUNTER — Ambulatory Visit: Payer: PRIVATE HEALTH INSURANCE | Admitting: Endocrinology

## 2016-01-30 NOTE — Telephone Encounter (Signed)
Pt [facility]

## 2016-02-02 ENCOUNTER — Other Ambulatory Visit: Payer: Self-pay

## 2016-02-02 MED ORDER — METHIMAZOLE 10 MG PO TABS: ORAL_TABLET | ORAL | 3 refills | Status: DC

## 2016-02-02 NOTE — Telephone Encounter (Signed)
Ordered 02/02/16 

## 2016-02-17 ENCOUNTER — Other Ambulatory Visit: Payer: Self-pay

## 2016-02-17 ENCOUNTER — Ambulatory Visit (INDEPENDENT_AMBULATORY_CARE_PROVIDER_SITE_OTHER): Payer: 59 | Admitting: Endocrinology

## 2016-02-17 ENCOUNTER — Encounter: Payer: Self-pay | Admitting: Endocrinology

## 2016-02-17 VITALS — BP 150/88 | HR 80 | Ht 71.0 in | Wt 175.0 lb

## 2016-02-17 DIAGNOSIS — E0590 Thyrotoxicosis, unspecified without thyrotoxic crisis or storm: Secondary | ICD-10-CM

## 2016-02-17 MED ORDER — FREESTYLE LIBRE SENSOR SYSTEM MISC: 3.0000 | 3 refills | Status: DC

## 2016-02-17 MED ORDER — FREESTYLE LIBRE READER DEVI: 1.0000 | Freq: Once | 0 refills | Status: DC

## 2016-02-17 NOTE — Progress Notes (Signed)
Patient ID: Greg Patrick, male   DOB: 1980-11-14, 36 y.o.   MRN: [id number]                                                                                                               Reason for Appointment:  Hyperthyroidism, follow-up  Referring physician: None   History of Present Illness:   Background history: Since about 2-3 years ago patient  had symptoms of shakiness, feeling excessively hot and sweaty, some palpitations and fatigue. Although he thinks he lost only 5 pounds he thinks his weight was about 170 pounds prior to his symptoms; in May his weight was recorded as 148 He was admitted to the hospital with chest pain and shortness of breath but was found to be tachycardic and hyperthyroid He was started on methimazole in 05/2014 at the hospital but he was finally seen in follow-up in July  Recent history: His methimazole was restarted in 10/16 when he again had a marked increase in free T4 level, previously had become hypothyroid with 15 mg methimazole  He was able to gradually reduce his dose subsequently The dose was reduced to 10 mg alternating with 5 mg in 7/17 and then to 5 mg every other day in 10/17  Thyrotropin receptor antibody in 6/17 and was only mildly increased at  2.1 and is normal as of 9/17   Because of his thyroid levels starting to increase again he has been referred for I-131 treatment but still has not been able to get it He was scheduled last month but this was postponed because of back weather.  Also is concerned about doing this has he was told that he cannot be intimate with his wife for 6 months afterwards  Also he was supposed to take 10 mg of methimazole but is taking only 5 mg currently She denies any symptoms of palpitations or shakiness, has no significant weight loss  Labs show relatively lower free T4 but free T3 is high normal  Wt Readings from Last 3 Encounters:  02/17/16 175 lb (79.4 kg)  11/21/15 177 lb (80.3 kg)  10/08/15 178 lb  (80.7 kg)    Free T4 now is higher Again  Lab Results  Component Value Date   TSH 0.01 (L) 11/17/2015   TSH 0.02 (L) 10/08/2015   TSH 0.01 (L) 09/10/2015   FREET4 2.44 (H) 01/26/2016   FREET4 2.28 (H) 11/17/2015   FREET4 0.82 10/08/2015   Lab Results  Component Value Date   T3FREE 3.9 08/04/2015   T3FREE 2.9 06/23/2015   T3FREE 4.0 05/12/2015      Allergies as of 02/17/2016      Reactions   Bee Venom Anaphylaxis   Fish-derived Products Anaphylaxis      Medication List       Accurate as of 02/17/16  9:27 PM. Always use your most recent med list.          aspirin EC 81 MG tablet Take 1 tablet (81 mg total) by mouth daily.   methimazole 10  MG tablet Commonly known as:  TAPAZOLE TAKE ONE TABLET BY MOUTH IN THE MORNING           Past Medical History:  Diagnosis Date  . Asthma   . Pancreatitis   . 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  Family History  Problem Relation Age of Onset  . Deep vein thrombosis Mother   . Hypertension Father     Social History:  reports that he has been smoking Cigarettes.  He has never used smokeless tobacco. He reports that he drinks alcohol. He reports that he does not use drugs.  Allergies:  Allergies  Allergen Reactions  . Bee Venom Anaphylaxis  . Fish-Derived Products Anaphylaxis    Review of Systems:       No history of hypertension, he thinks blood pressure is high from anxiety today    Examination:   BP (!) 150/88   Pulse 80   Ht 5\' 11"  (1.803 m)   Wt 175 lb (79.4 kg)   SpO2 99%   BMI 24.41 kg/m   His eyes appear normal Neck: The thyroid is enlarged about 4 times normal on the right, relatively soft and about 2 times normal on the left. 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 He has had variable doses of  methimazole and unable to get into remission  He has been advised 25 mCi of I-131 treatment and the treatment has been delayed or postpone even though it was initially ordered in November  His thyroid appears to be relatively larger especially on the right side  His free T4 significantly high with current regimen of 5 mg methimazole although he is not very symptomatic  Reassured him that he can have I-131 treatment and subsequently have children with his wife but may not be able to be intimate for at least a couple of weeks He will schedule the treatment Meanwhile increase methimazole to 10 mg  Patient Instructions  Take 10mg  full tab      [facility],Kionna Brier 02/17/2016, 9:27 PM   Note: This office note was prepared with Dragon voice recognition system technology. Any transcriptional errors that result from this process are unintentional.

## 2016-02-17 NOTE — Patient Instructions (Signed)
Take 10mg  full tab

## 2016-03-11 ENCOUNTER — Telehealth: Payer: Self-pay | Admitting: Endocrinology

## 2016-03-11 NOTE — Telephone Encounter (Signed)
Refill of   methimazole (TAPAZOLE) 10 MG tablet  [facility] [address], [place] [id number] [phone]

## 2016-03-12 MED ORDER — METHIMAZOLE 10 MG PO TABS: ORAL_TABLET | ORAL | 3 refills | Status: DC

## 2016-03-12 NOTE — Telephone Encounter (Signed)
Refill submitted. 

## 2016-03-26 ENCOUNTER — Other Ambulatory Visit (INDEPENDENT_AMBULATORY_CARE_PROVIDER_SITE_OTHER): Payer: 59

## 2016-03-26 DIAGNOSIS — E0590 Thyrotoxicosis, unspecified without thyrotoxic crisis or storm: Secondary | ICD-10-CM

## 2016-03-26 LAB — T3, FREE: T3, Free: 9.4 pg/mL — ABNORMAL HIGH (ref 2.3–4.2)

## 2016-03-26 LAB — T4, FREE: Free T4: 2.69 ng/dL — ABNORMAL HIGH (ref 0.60–1.60)

## 2016-03-30 ENCOUNTER — Encounter: Payer: Self-pay | Admitting: Endocrinology

## 2016-03-30 ENCOUNTER — Ambulatory Visit (INDEPENDENT_AMBULATORY_CARE_PROVIDER_SITE_OTHER): Payer: 59 | Admitting: Endocrinology

## 2016-03-30 VITALS — BP 130/82 | HR 66 | Ht 71.0 in | Wt 173.0 lb

## 2016-03-30 DIAGNOSIS — E0590 Thyrotoxicosis, unspecified without thyrotoxic crisis or storm: Secondary | ICD-10-CM

## 2016-03-30 NOTE — Patient Instructions (Signed)
Take 2 pills daily  Restart pills with 1 daily, 3 days after dose

## 2016-03-30 NOTE — Progress Notes (Signed)
Patient ID: Greg Patrick, male   DOB: 1980-09-26, 36 y.o.   MRN: [id number]                                                                                                               Reason for Appointment:  Hyperthyroidism, follow-up  Referring physician: None   History of Present Illness:   Background history: Since about 2-3 years ago patient  had symptoms of shakiness, feeling excessively hot and sweaty, some palpitations and fatigue. Although he thinks he lost only 5 pounds he thinks his weight was about 170 pounds prior to his symptoms; in May his weight was recorded as 148 He was admitted to the hospital with chest pain and shortness of breath but was found to be tachycardic and hyperthyroid He was started on methimazole in 05/2014 at the hospital but he was finally seen in follow-up in July  Recent history: His methimazole was restarted in 10/16 when he again had a marked increase in free T4 level, previously had become hypothyroid with 15 mg methimazole  He was able to gradually reduce his dose subsequently The dose was reduced to 10 mg alternating with 5 mg in 7/17 and then to 5 mg every other day in 10/17  Thyrotropin receptor antibody in 6/17 and was only mildly increased at  2.1 and is normal as of 9/17   Because of his thyroid levels starting to increase again he has been referred for I-131 treatment but still has not been able to get it He says he wants to schedule it when he can be away from his kids He is now scheduled for April 13  He says that he is feeling fairly good without any heat intolerance, shakiness, palpitations or fatigue He is overall active On his last visit his methimazole was increased up to 10 mg daily  Labs show relatively higher free T4 and free T3 levels now   Wt Readings from Last 3 Encounters:  03/30/16 173 lb (78.5 kg)  02/17/16 175 lb (79.4 kg)  11/21/15 177 lb (80.3 kg)     Lab Results  Component Value Date   TSH 0.01 (L)  11/17/2015   TSH 0.02 (L) 10/08/2015   TSH 0.01 (L) 09/10/2015   FREET4 2.69 (H) 03/26/2016   FREET4 2.44 (H) 01/26/2016   FREET4 2.28 (H) 11/17/2015   Lab Results  Component Value Date   T3FREE 9.4 (H) 03/26/2016   T3FREE 3.9 08/04/2015   T3FREE 2.9 06/23/2015      Allergies as of 03/30/2016      Reactions   Bee Venom Anaphylaxis   Fish-derived Products Anaphylaxis      Medication List       Accurate as of 03/30/16  2:21 PM. Always use your most recent med list.          aspirin EC 81 MG tablet Take 1 tablet (81 mg total) by mouth daily.   methimazole 10 MG tablet Commonly known as:  TAPAZOLE TAKE ONE TABLET BY MOUTH IN THE MORNING  Past Medical History:  Diagnosis Date  . Asthma   . Pancreatitis   . 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  Family History  Problem Relation Age of Onset  . Deep vein thrombosis Mother   . Hypertension Father     Social History:  reports that he has been smoking Cigarettes.  He has never used smokeless tobacco. He reports that he drinks alcohol. He reports that he does not use drugs.  Allergies:  Allergies  Allergen Reactions  . Bee Venom Anaphylaxis  . Fish-Derived Products Anaphylaxis    Review of Systems:       No history of hypertension, he thinks blood pressure is high from anxiety today    Examination:   BP 130/82   Pulse 66   Ht 5\' 11"  (1.803 m)   Wt 173 lb (78.5 kg)   SpO2 98%   BMI 24.13 kg/m   His eyes appear normal Neck: The thyroid is enlarged about 4 times normal on the right, relatively soft and about 2 times normal on the left. 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   Darlys Buis 03/30/2016, 2:21 PM   Note: This office note was prepared with Dragon voice recognition system technology. Any transcriptional errors that result from this process are unintentional.

## 2016-04-14 ENCOUNTER — Other Ambulatory Visit: Payer: Self-pay

## 2016-04-14 MED ORDER — METHIMAZOLE 10 MG PO TABS: ORAL_TABLET | ORAL | 3 refills | Status: DC

## 2016-04-16 ENCOUNTER — Ambulatory Visit (HOSPITAL_COMMUNITY)
Admission: RE | Admit: 2016-04-16 | Discharge: 2016-04-16 | Disposition: A | Discharge status: Home / Self Care | Payer: 59 | Source: Ambulatory Visit | Attending: Endocrinology | Admitting: Endocrinology

## 2016-04-16 DIAGNOSIS — E0590 Thyrotoxicosis, unspecified without thyrotoxic crisis or storm: Secondary | ICD-10-CM | POA: Diagnosis present

## 2016-04-16 MED ORDER — SODIUM IODIDE I 131 CAPSULE
25.0000 | Freq: Once | INTRAVENOUS | Status: AC | PRN
  Administered 2016-04-16: 25 via ORAL

## 2016-04-21 ENCOUNTER — Other Ambulatory Visit: Payer: Self-pay

## 2016-04-21 ENCOUNTER — Telehealth: Payer: Self-pay | Admitting: Endocrinology

## 2016-04-21 MED ORDER — METHIMAZOLE 10 MG PO TABS: ORAL_TABLET | ORAL | 3 refills | Status: DC

## 2016-04-21 NOTE — Telephone Encounter (Signed)
Patient has questions about what should he take his meds after his radiation Iodine treatment, please advvise   He also need a refill of his  methimazole (TAPAZOLE) 10 MG tablet  [id number] - [place], [place] - [address] [phone] (Phone) [phone] (Fax)

## 2016-04-21 NOTE — Telephone Encounter (Signed)
Called and left a vm for the patient to give me a call back to let me know what his questions are

## 2016-04-21 NOTE — Telephone Encounter (Signed)
He had been told that 3 days after the radioactive iodine treatment he will take methimazole 10 mg

## 2016-04-23 ENCOUNTER — Other Ambulatory Visit: Payer: Self-pay

## 2016-04-23 MED ORDER — METHIMAZOLE 10 MG PO TABS: ORAL_TABLET | ORAL | 3 refills | Status: DC

## 2016-04-23 NOTE — Telephone Encounter (Signed)
Spoke with patient and he wanted the prescription called in to [facility]- this has been done

## 2016-05-07 ENCOUNTER — Other Ambulatory Visit: Payer: 59

## 2016-05-10 ENCOUNTER — Other Ambulatory Visit (INDEPENDENT_AMBULATORY_CARE_PROVIDER_SITE_OTHER): Payer: 59

## 2016-05-10 DIAGNOSIS — E0590 Thyrotoxicosis, unspecified without thyrotoxic crisis or storm: Secondary | ICD-10-CM

## 2016-05-10 LAB — T4, FREE: Free T4: 1.35 ng/dL (ref 0.60–1.60)

## 2016-05-11 ENCOUNTER — Ambulatory Visit: Payer: 59 | Admitting: Endocrinology

## 2016-06-14 ENCOUNTER — Ambulatory Visit (INDEPENDENT_AMBULATORY_CARE_PROVIDER_SITE_OTHER): Payer: 59 | Admitting: Endocrinology

## 2016-06-14 ENCOUNTER — Encounter: Payer: Self-pay | Admitting: Endocrinology

## 2016-06-14 VITALS — BP 130/95 | HR 84 | Ht 71.0 in | Wt 181.6 lb

## 2016-06-14 DIAGNOSIS — E0590 Thyrotoxicosis, unspecified without thyrotoxic crisis or storm: Secondary | ICD-10-CM | POA: Diagnosis not present

## 2016-06-14 LAB — TSH: TSH: 1.89 u[IU]/mL (ref 0.35–4.50)

## 2016-06-14 LAB — T4, FREE: FREE T4: 0.22 ng/dL — AB (ref 0.60–1.60)

## 2016-06-14 MED ORDER — LEVOTHYROXINE SODIUM 137 MCG PO TABS: 137.0000 ug | ORAL_TABLET | Freq: Every day | ORAL | 2 refills | Status: DC

## 2016-06-14 NOTE — Progress Notes (Signed)
Please call to let patient know that the lab results showed a thyroid levels are very low, have sent prescription for levothyroxine 137 g daily which is to be taken as a thyroid supplement every morning before breakfast His follow-up needs to be last week of July with labs a couple of days before

## 2016-06-14 NOTE — Progress Notes (Signed)
Patient ID: Greg Patrick, male   DOB: [birth year], 36 y.o.   MRN: [id number]                                                                                                               Reason for Appointment:  Hyperthyroidism, follow-up    History of Present Illness:   Background history: Since about 2-3 years ago patient  had symptoms of shakiness, feeling excessively hot and sweaty, some palpitations and fatigue. Although he thinks he lost only 5 pounds he thinks his weight was about 170 pounds prior to his symptoms; in May his weight was recorded as 148 He was admitted to the hospital with chest pain and shortness of breath but was found to be tachycardic and hyperthyroid He was started on methimazole in 05/2014 at the hospital but he was finally seen in follow-up in July His methimazole was restarted in 10/16 when he again had a marked increase in free T4 level, previously had become hypothyroid with 15 mg methimazole  Recent history:  He had been on variable doses of methimazole in 2017  Thyrotropin receptor antibody in 6/17  was only mildly increased at  2.1 and is normal as of 9/17   Because of his thyroid levels starting to increase again he had been referred for I-131 treatment   This was finally done on 04/16/16 with 24.7 mCi He says he feels overall much better and has improved energy level A couple of weeks ago he felt occasional palpitations but not now He is off methimazole  Labs show normal free T4 1 month ago   Wt Readings from Last 3 Encounters:  06/14/16 181 lb 9.6 oz (82.4 kg)  03/30/16 173 lb (78.5 kg)  02/17/16 175 lb (79.4 kg)     Lab Results  Component Value Date   TSH 0.01 (L) 11/17/2015   TSH 0.02 (L) 10/08/2015   TSH 0.01 (L) 09/10/2015   FREET4 1.35 05/10/2016   FREET4 2.69 (H) 03/26/2016   FREET4 2.44 (H) 01/26/2016   Lab Results  Component Value Date   T3FREE 9.4 (H) 03/26/2016   T3FREE 3.9 08/04/2015   T3FREE 2.9 06/23/2015       Allergies as of 06/14/2016      Reactions   Bee Venom Anaphylaxis   Fish-derived Products Anaphylaxis      Medication List       Accurate as of 06/14/16  2:24 PM. Always use your most recent med list.          aspirin EC 81 MG tablet Take 1 tablet (81 mg total) by mouth daily.   methimazole 10 MG tablet Commonly known as:  TAPAZOLE TAKE ONE TABLET BY MOUTH IN THE MORNING           Past Medical History:  Diagnosis Date  . Asthma   . Pancreatitis   . 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  Family History  Problem Relation Age of Onset  .  Deep vein thrombosis Mother   . Hypertension Father     Social History:  reports that he has been smoking Cigarettes.  He has never used smokeless tobacco. He reports that he drinks alcohol. He reports that he does not use drugs.  Allergies:  Allergies  Allergen Reactions  . Bee Venom Anaphylaxis  . 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  Hypertension: Blood pressure may be influenced by possible hypothyroidism and will check again on the next visit  There are no Patient Instructions on file for this visit.   Brigham Cobbins 06/14/2016, 2:24 PM   Note: This office note was prepared with Dragon voice recognition system technology. Any transcriptional errors that result from this process are unintentional.

## 2016-06-17 ENCOUNTER — Telehealth: Payer: Self-pay | Admitting: Endocrinology

## 2016-06-17 NOTE — Telephone Encounter (Signed)
Patient returning a call from BlyMegan F about lab results. Transferred the call to Megan B.

## 2016-06-17 NOTE — Telephone Encounter (Signed)
I spoke with the patient and advised of blood test results from 06/14/2016;  Notes recorded by Reather LittlerKumar, Ajay, MD on 06/14/2016 at 6:43 PM EDT Please call to let patient know that the lab results showed a thyroid levels are very low, have sent prescription for levothyroxine 137 g daily which is to be taken as a thyroid supplement every morning before breakfast His follow-up needs to be last week of July with labs a couple of days before  Patient schedule for lab appointment on 08/02/2016 at office visit 08/04/2016.

## 2016-07-08 ENCOUNTER — Other Ambulatory Visit: Payer: 59

## 2016-07-21 ENCOUNTER — Ambulatory Visit: Payer: 59 | Admitting: Endocrinology

## 2016-08-02 ENCOUNTER — Other Ambulatory Visit (INDEPENDENT_AMBULATORY_CARE_PROVIDER_SITE_OTHER): Payer: 59

## 2016-08-02 DIAGNOSIS — E0590 Thyrotoxicosis, unspecified without thyrotoxic crisis or storm: Secondary | ICD-10-CM | POA: Diagnosis not present

## 2016-08-02 LAB — TSH: TSH: <0.01 u[IU]/mL — ABNORMAL LOW (ref 0.35–4.50)

## 2016-08-02 LAB — T4, FREE: FREE T4: 2.08 ng/dL — AB (ref 0.60–1.60)

## 2016-08-04 ENCOUNTER — Encounter: Payer: Self-pay | Admitting: Endocrinology

## 2016-08-04 ENCOUNTER — Ambulatory Visit (INDEPENDENT_AMBULATORY_CARE_PROVIDER_SITE_OTHER): Payer: 59 | Admitting: Endocrinology

## 2016-08-04 VITALS — BP 140/98 | HR 89 | Ht 71.0 in | Wt 181.4 lb

## 2016-08-04 DIAGNOSIS — I10 Essential (primary) hypertension: Secondary | ICD-10-CM

## 2016-08-04 DIAGNOSIS — E0590 Thyrotoxicosis, unspecified without thyrotoxic crisis or storm: Secondary | ICD-10-CM

## 2016-08-04 MED ORDER — LEVOTHYROXINE SODIUM 25 MCG PO TABS: 25.0000 ug | ORAL_TABLET | Freq: Every day | ORAL | 3 refills | Status: DC

## 2016-08-04 MED ORDER — TRIAMTERENE-HCTZ 37.5-25 MG PO TABS: 0.5000 | ORAL_TABLET | Freq: Every day | ORAL | 3 refills | Status: DC

## 2016-08-04 NOTE — Progress Notes (Signed)
Patient ID: Greg PonsMichael Patrick, male   DOB: 07-31-1980, 36 y.o.   MRN: [id number]                                                                                                               Reason for Appointment:  Hyperthyroidism, follow-up    History of Present Illness:   Background history: Since about 2-3 years ago patient  had symptoms of shakiness, feeling excessively hot and sweaty, some palpitations and fatigue. Although he thinks he lost only 5 pounds he thinks his weight was about 170 pounds prior to his symptoms; in May his weight was recorded as 148 He was admitted to the hospital with chest pain and shortness of breath but was found to be tachycardic and hyperthyroid He was started on methimazole in 05/2014 at the hospital but he was finally seen in follow-up in July His methimazole was restarted in 10/16 when he again had a marked increase in free T4 level, previously had become hypothyroid with 15 mg methimazole  Recent history:  He had been on variable doses of methimazole in 2017  Thyrotropin receptor antibody in 6/17  was only mildly increased at  2.1 and is normal as of 9/17  Because of his thyroid levels starting to increase again he had I-131 treatment on 04/16/16 with 24.7 mCi  Has been feeling overall much better since his I-131 treatment No recurrence of palpitations, shakiness or heat intolerance  Even without methimazole his free T4 level had dropped down to 0.22 in June and he had gained some weight He has been started on levothyroxine 137 g daily since then He does not feel any different with this and overall still feels great He thinks his thyroid swelling is reduced  Labs show his free T4 level has jumped up much higher than before and in hyperthyroid range again   Wt Readings from Last 3 Encounters:  08/04/16 181 lb 6.4 oz (82.3 kg)  06/14/16 181 lb 9.6 oz (82.4 kg)  03/30/16 173 lb (78.5 kg)     Lab Results  Component Value Date   TSH <0.01 (L)  08/02/2016   TSH 1.89 06/14/2016   TSH 0.01 (L) 11/17/2015   FREET4 2.08 (H) 08/02/2016   FREET4 0.22 (L) 06/14/2016   FREET4 1.35 05/10/2016   Lab Results  Component Value Date   T3FREE 9.4 (H) 03/26/2016   T3FREE 3.9 08/04/2015   T3FREE 2.9 06/23/2015      Allergies as of 08/04/2016      Reactions   Bee Venom Anaphylaxis   Fish-derived Products Anaphylaxis      Medication List       Accurate as of 08/04/16  1:10 PM. Always use your most recent med list.          aspirin EC 81 MG tablet Take 1 tablet (81 mg total) by mouth daily.   levothyroxine 137 MCG tablet Commonly known as:  SYNTHROID Take 1 tablet (137 mcg total) by mouth daily before breakfast.  Past Medical History:  Diagnosis Date  . Asthma   . Pancreatitis   . 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  Family History  Problem Relation Age of Onset  . Deep vein thrombosis Mother   . Hypertension Father     Social History:  reports that he has been smoking Cigarettes.  He has never used smokeless tobacco. He reports that he drinks alcohol. He reports that he does not use drugs.  Allergies:  Allergies  Allergen Reactions  . Bee Venom Anaphylaxis  . Fish-Derived Products Anaphylaxis    Review of Systems:       No history of hypertension, again he thinks blood pressure is high from Having a stressful day   BP Readings from Last 3 Encounters:  08/04/16 (!) 140/98  06/14/16 (!) 130/95  03/30/16 130/82      Examination:   BP (!) 140/98   Pulse 89   Ht 5\' 11"  (1.803 m)   Wt 181 lb 6.4 oz (82.3 kg)   SpO2 96%   BMI 25.30 kg/m   Neck: The thyroid is enlarged about 2 times normal on the right, Smooth, mostly soft and 1-1/2 times normal on the left Deep tendon reflexes at biceps normal No tremor Skin slightly warm on hands   Assessment/Plan:   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, treated with  I-131  Although he had significant hypothyroidism last month about 2 months after his I-131 treatment with taking 137 g of levothyroxine he has become frankly hyperthyroid again with  increase in free T4  He is subjectively doing well however His thyroid is enlarged and is still about the same as last time  Unclear whether he did not have complete ablation of his thyroid function or whether he is only minimally hypothyroid  He will change his levothyroxine to 25 g daily and follow-up in 6 weeks    Hypertension: Blood pressure is consistently high and he likely has hypertension especially with his family History He will start half tablet of Maxzide 37.5 daily Follow-up with renal function evaluation next time   There are no Patient Instructions on file for this visit.   Greg Patrick 08/04/2016, 1:10 PM   Note: This office note was prepared with Insurance underwriterDragon voice recognition system technology. Any transcriptional errors that result from this process are unintentional.

## 2016-09-13 ENCOUNTER — Other Ambulatory Visit: Payer: 59

## 2016-09-13 ENCOUNTER — Other Ambulatory Visit: Payer: Self-pay

## 2016-09-13 ENCOUNTER — Other Ambulatory Visit (INDEPENDENT_AMBULATORY_CARE_PROVIDER_SITE_OTHER): Payer: Self-pay

## 2016-09-13 DIAGNOSIS — I10 Essential (primary) hypertension: Secondary | ICD-10-CM

## 2016-09-13 DIAGNOSIS — E0590 Thyrotoxicosis, unspecified without thyrotoxic crisis or storm: Secondary | ICD-10-CM

## 2016-09-13 LAB — COMPREHENSIVE METABOLIC PANEL
ALT: 48 U/L (ref 0–53)
AST: 43 U/L — ABNORMAL HIGH (ref 0–37)
Albumin: 4.1 g/dL (ref 3.5–5.2)
Alkaline Phosphatase: 84 U/L (ref 39–117)
BUN: 13 mg/dL (ref 6–23)
CHLORIDE: 105 meq/L (ref 96–112)
CO2: 26 meq/L (ref 19–32)
Calcium: 9.5 mg/dL (ref 8.4–10.5)
Creatinine, Ser: 0.8 mg/dL (ref 0.40–1.50)
GFR: 141.3 mL/min (ref >60.00)
GLUCOSE: 126 mg/dL — AB (ref 70–99)
POTASSIUM: 3.7 meq/L (ref 3.5–5.1)
SODIUM: 139 meq/L (ref 135–145)
Total Bilirubin: 0.8 mg/dL (ref 0.2–1.2)
Total Protein: 7.3 g/dL (ref 6.0–8.3)

## 2016-09-13 LAB — TSH

## 2016-09-13 LAB — T4, FREE: FREE T4: 2.74 ng/dL — AB (ref 0.60–1.60)

## 2016-09-15 ENCOUNTER — Encounter: Payer: Self-pay | Admitting: Endocrinology

## 2016-09-15 ENCOUNTER — Ambulatory Visit (INDEPENDENT_AMBULATORY_CARE_PROVIDER_SITE_OTHER): Payer: 59 | Admitting: Endocrinology

## 2016-09-15 VITALS — BP 148/92 | HR 94 | Ht 71.0 in | Wt 172.6 lb

## 2016-09-15 DIAGNOSIS — E0590 Thyrotoxicosis, unspecified without thyrotoxic crisis or storm: Secondary | ICD-10-CM

## 2016-09-15 MED ORDER — METHIMAZOLE 10 MG PO TABS: 10.0000 mg | ORAL_TABLET | Freq: Two times a day (BID) | ORAL | 2 refills | Status: DC

## 2016-09-15 NOTE — Progress Notes (Signed)
Patient ID: Greg PonsMichael Patrick, male   DOB: 1980/05/24, 36 y.o.   MRN: [id number]                                                                                                               Reason for Appointment:  Hyperthyroidism, follow-up    History of Present Illness:   Background history: Since about 2-3 years ago patient  had symptoms of shakiness, feeling excessively hot and sweaty, some palpitations and fatigue. Although he thinks he lost only 5 pounds he thinks his weight was about 170 pounds prior to his symptoms; in May his weight was recorded as 148 He was admitted to the hospital with chest pain and shortness of breath but was found to be tachycardic and hyperthyroid He was started on methimazole in 05/2014 at the hospital but he was finally seen in follow-up in July His methimazole was restarted in 10/16 when he again had a marked increase in free T4 level, previously had become hypothyroid with 15 mg methimazole  Recent history:  He had been on variable doses of methimazole in 2017 Thyrotropin receptor antibody in 6/17  was only mildly increased at  2.1 and was normal as of 9/17   Because of his thyroid levels starting to increase again in 2018 he had I-131 treatment on 04/16/16 with 24.7 mCi His thyroid enlargement was at least 4 times normal at baseline  Subsequently even without methimazole his free T4 level had dropped down to 0.22 in June and he had gained some weight He was then started on levothyroxine 137 g daily.  Because of high free T4 level subsequently this was reduced to 25 g last month  He does not feel any different recently and does not complain of heat or cold intolerance, shakiness, palpitations or sweating However has lost significant amount of weight  Labs show his free T4 level has jumped up much higher than before and in hyperthyroid range again   Wt Readings from Last 3 Encounters:  09/15/16 172 lb 9.6 oz (78.3 kg)  08/04/16 181 lb 6.4 oz (82.3  kg)  06/14/16 181 lb 9.6 oz (82.4 kg)     Lab Results  Component Value Date   TSH <0.01 (L) 09/13/2016   TSH <0.01 (L) 08/02/2016   TSH 1.89 06/14/2016   FREET4 2.74 (H) 09/13/2016   FREET4 2.08 (H) 08/02/2016   FREET4 0.22 (L) 06/14/2016   Lab Results  Component Value Date   T3FREE 9.4 (H) 03/26/2016   T3FREE 3.9 08/04/2015   T3FREE 2.9 06/23/2015      Allergies as of 09/15/2016      Reactions   Bee Venom Anaphylaxis   Fish-derived Products Anaphylaxis      Medication List       Accurate as of 09/15/16  2:13 PM. Always use your most recent med list.          aspirin EC 81 MG tablet Take 1 tablet (81 mg total) by mouth daily.   levothyroxine 25 MCG tablet Commonly known  as:  SYNTHROID Take 1 tablet (25 mcg total) by mouth daily before breakfast.   triamterene-hydrochlorothiazide 37.5-25 MG tablet Commonly known as:  MAXZIDE-25 Take 0.5 tablets by mouth daily.           Past Medical History:  Diagnosis Date  . Asthma   . Pancreatitis   . 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  Family History  Problem Relation Age of Onset  . Deep vein thrombosis Mother   . Hypertension Father     Social History:  reports that he has been smoking Cigarettes.  He has never used smokeless tobacco. He reports that he drinks alcohol. He reports that he does not use drugs.  Allergies:  Allergies  Allergen Reactions  . Bee Venom Anaphylaxis  . Fish-Derived Products Anaphylaxis    Review of Systems:      HYPERTENSION: His blood pressure has been consistently high and since 8/18 has been taking half tablet of Maxzide daily  BP Readings from Last 3 Encounters:  09/15/16 (!) 148/92  08/04/16 (!) 140/98  06/14/16 (!) 130/95      Examination:   BP (!) 148/92   Pulse 94   Ht  (1.803 m)   Wt 172 lb 9.6 oz (78.3 kg)   SpO2 95%   BMI 24.07 kg/m   No significant eye signs present   The thyroid is  enlarged about 2 times normal on the right, Smooth, mostly soft and 1-1/2 times normal on the left Deep tendon reflexes Very brisk at biceps   No tremor Skin slightly warm on hands   Assessment/Plan:   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, treated with I-131  Although he had significant hypothyroidism about 2 months after his I-131 treatment he subsequently appears to have had a recurrence of his hyperthyroidism Surprisingly she is not very symptomatic He still has a goiter although this is still much smaller than before his I-131 treatment  Discussed with the patient that since he has a recurrence of the hyperthyroidism he will need to be read treated with I-131 considering his thyroid levels are significantly high  For now he will go back to taking methimazole 10 mg twice a day to control his hyperthyroidism for retreating with I-131, discussed that he will also need another uptake done treatment    Hypertension: Blood pressure is still relatively high and he will go up to the full tablet of Maxide  There are no Patient Instructions on file for this visit.   Annet Manukyan 09/15/2016, 2:13 PM   Note: This office note was prepared with Dragon voice recognition system technology. Any transcriptional errors that result from this process are unintentional.

## 2016-09-15 NOTE — Patient Instructions (Addendum)
Restart Methimazole

## 2016-09-24 ENCOUNTER — Other Ambulatory Visit: Payer: Self-pay

## 2016-09-24 MED ORDER — TRIAMTERENE-HCTZ 37.5-25 MG PO TABS: 0.5000 | ORAL_TABLET | Freq: Every day | ORAL | 1 refills | Status: AC

## 2016-10-29 ENCOUNTER — Other Ambulatory Visit: Payer: 59

## 2016-11-01 ENCOUNTER — Ambulatory Visit: Payer: 59 | Admitting: Endocrinology

## 2016-11-01 DIAGNOSIS — Z0289 Encounter for other administrative examinations: Secondary | ICD-10-CM

## 2016-12-06 ENCOUNTER — Other Ambulatory Visit (INDEPENDENT_AMBULATORY_CARE_PROVIDER_SITE_OTHER): Payer: 59

## 2016-12-06 DIAGNOSIS — E0590 Thyrotoxicosis, unspecified without thyrotoxic crisis or storm: Secondary | ICD-10-CM

## 2016-12-06 LAB — TSH: TSH: 7.12 u[IU]/mL — ABNORMAL HIGH (ref 0.35–4.50)

## 2016-12-06 LAB — T4, FREE: Free T4: 0.33 ng/dL — ABNORMAL LOW (ref 0.60–1.60)

## 2016-12-06 LAB — T3, FREE: T3 FREE: 2.7 pg/mL (ref 2.3–4.2)

## 2016-12-13 ENCOUNTER — Ambulatory Visit: Payer: 59 | Admitting: Endocrinology

## 2016-12-15 ENCOUNTER — Ambulatory Visit: Payer: 59 | Admitting: Endocrinology

## 2016-12-15 ENCOUNTER — Encounter: Payer: Self-pay | Admitting: Endocrinology

## 2016-12-15 VITALS — BP 152/94 | HR 81 | Ht 71.0 in | Wt 186.0 lb

## 2016-12-15 DIAGNOSIS — I10 Essential (primary) hypertension: Secondary | ICD-10-CM

## 2016-12-15 DIAGNOSIS — E0590 Thyrotoxicosis, unspecified without thyrotoxic crisis or storm: Secondary | ICD-10-CM

## 2016-12-15 NOTE — Progress Notes (Signed)
Patient ID: Greg Patrick, male   DOB: November 10, 1980, 36 y.o.   MRN: [id number]                                                                                                               Reason for Appointment:  Hyperthyroidism, follow-up    History of Present Illness:   Background history: Since about 2-3 years ago patient  had symptoms of shakiness, feeling excessively hot and sweaty, some palpitations and fatigue. Although he thinks he lost only 5 pounds he thinks his weight was about 170 pounds prior to his symptoms; in May his weight was recorded as 148 He was admitted to the hospital with chest pain and shortness of breath but was found to be tachycardic and hyperthyroid He was started on methimazole in 05/2014 at the hospital but he was finally seen in follow-up in July His methimazole was restarted in 10/16 when he again had a marked increase in free T4 level, previously had become hypothyroid with 15 mg methimazole  Recent history:  He had been on variable doses of methimazole in 2017 Thyrotropin receptor antibody in 6/17  was only mildly increased at  2.1 and was normal as of 9/17   Because of his thyroid levels starting to increase again in 2018 he had I-131 treatment on 04/16/16 with 24.7 mCi His thyroid enlargement was at least 4 times normal at baseline Subsequently even without methimazole his free T4 level had dropped down to 0.22 in June and he had gained some weight He was then started on levothyroxine 137 g daily.  Because of high free T4 level subsequently this was reduced to 25 g  However in 09/2016 he had become frankly hyperthyroid again although he had minimal symptoms, did have some weight loss He was started on methimazole 10 mg twice a day However he has not come back in follow-up since then  He feels fairly good, he thinks he is eating better because of better diet Does not think he is getting cold or having dry skin constipation or other symptoms of  hypothyroidism  Labs show his free T4 level has sharply gone down to 0.33 now along with relatively high TSH   Wt Readings from Last 3 Encounters:  12/15/16 186 lb (84.4 kg)  09/15/16 172 lb 9.6 oz (78.3 kg)  08/04/16 181 lb 6.4 oz (82.3 kg)     Lab Results  Component Value Date   TSH 7.12 (H) 12/06/2016   TSH <0.01 (L) 09/13/2016   TSH <0.01 (L) 08/02/2016   FREET4 0.33 (L) 12/06/2016   FREET4 2.74 (H) 09/13/2016   FREET4 2.08 (H) 08/02/2016   Lab Results  Component Value Date   T3FREE 2.7 12/06/2016   T3FREE 9.4 (H) 03/26/2016   T3FREE 3.9 08/04/2015      Allergies as of 12/15/2016      Reactions   Bee Venom Anaphylaxis   Fish-derived Products Anaphylaxis      Medication List        Accurate as of 12/15/16  3:46 PM. Always use your most recent med list.          aspirin EC 81 MG tablet Take 1 tablet (81 mg total) by mouth daily.   methimazole 10 MG tablet Commonly known as:  TAPAZOLE Take 1 tablet (10 mg total) by mouth 2 (two) times daily.   triamterene-hydrochlorothiazide 37.5-25 MG tablet Commonly known as:  MAXZIDE-25 Take 0.5 tablets by mouth daily.           Past Medical History:  Diagnosis Date  . Asthma   . Pancreatitis   . 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  Family History  Problem Relation Age of Onset  . Deep vein thrombosis Mother   . Hypertension Father     Social History:  reports that he has been smoking cigarettes.  he has never used smokeless tobacco. He reports that he drinks alcohol. He reports that he does not use drugs.  Allergies:  Allergies  Allergen Reactions  . Bee Venom Anaphylaxis  . Fish-Derived Products Anaphylaxis    Review of Systems:      HYPERTENSION: His blood pressure has been consistently high and since 8/18 has been taking half tablet of Maxzide daily He was told to increase it to the full tablet but he has not  BP Readings from Last 3  Encounters:  12/15/16 (!) 152/94  09/15/16 (!) 148/92  08/04/16 (!) 140/98      Examination:   BP (!) 152/94   Pulse 81   Ht 5\' 11"  (1.803 m)   Wt 186 lb (84.4 kg)   SpO2 97%   BMI 25.94 kg/m     The thyroid is enlarged about 2 times normal on the right, Smooth, very soft Thyroid is enlarged about 1-1/2 times normal on the left, slightly firm Deep tendon reflexes  appear normal  No edema   Assessment/Plan:   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, treated with I-131  His thyroid levels have been fluctuating markedly over the last year He had been hypothyroid but subsequently became hyperthyroid again in September However has not followed up since starting methimazole again Now he is significantly hypothyroid again This despite his goiter being about the same Also surprisingly is asymptomatic  Currently not clear if he is starting to get into remission or whether he needs a lower dose of methimazole Since his free T4 is markedly reduced and TSH is high will first stop his methimazole and reevaluate in 6 weeks Again consider I-131 treatment if need be Discussed need for more regular follow-up    Hypertension: Blood pressure is still relatively high and he will go up to the full tablet of Maxide, new prescription sent  There are no Patient Instructions on file for this visit.   Reather LittlerAjay Len Kluver 12/15/2016, 3:46 PM   Note: This office note was prepared with Dragon voice recognition system technology. Any transcriptional errors that result from this process are unintentional.

## 2017-01-21 ENCOUNTER — Other Ambulatory Visit (INDEPENDENT_AMBULATORY_CARE_PROVIDER_SITE_OTHER): Payer: 59

## 2017-01-21 DIAGNOSIS — E0590 Thyrotoxicosis, unspecified without thyrotoxic crisis or storm: Secondary | ICD-10-CM | POA: Diagnosis not present

## 2017-01-21 DIAGNOSIS — I10 Essential (primary) hypertension: Secondary | ICD-10-CM | POA: Diagnosis not present

## 2017-01-21 LAB — BASIC METABOLIC PANEL
BUN: 14 mg/dL (ref 6–23)
CHLORIDE: 105 meq/L (ref 96–112)
CO2: 27 mEq/L (ref 19–32)
Calcium: 8.9 mg/dL (ref 8.4–10.5)
Creatinine, Ser: 0.87 mg/dL (ref 0.40–1.50)
GFR: 128 mL/min (ref >60.00)
Glucose, Bld: 148 mg/dL — ABNORMAL HIGH (ref 70–99)
Potassium: 3.8 mEq/L (ref 3.5–5.1)
SODIUM: 139 meq/L (ref 135–145)

## 2017-01-21 LAB — TSH: TSH: <0.01 u[IU]/mL — ABNORMAL LOW (ref 0.35–4.50)

## 2017-01-21 LAB — T4, FREE: FREE T4: 2.3 ng/dL — AB (ref 0.60–1.60)

## 2017-01-22 LAB — THYROTROPIN RECEPTOR AUTOABS: THYROTROPIN RECEPTOR AB: 5.17 IU/L — AB (ref 0.00–1.75)

## 2017-01-26 ENCOUNTER — Encounter: Payer: Self-pay | Admitting: Endocrinology

## 2017-01-26 ENCOUNTER — Ambulatory Visit (INDEPENDENT_AMBULATORY_CARE_PROVIDER_SITE_OTHER): Payer: 59 | Admitting: Endocrinology

## 2017-01-26 VITALS — BP 142/86 | HR 77 | Ht 71.0 in | Wt 179.0 lb

## 2017-01-26 DIAGNOSIS — E0590 Thyrotoxicosis, unspecified without thyrotoxic crisis or storm: Secondary | ICD-10-CM | POA: Diagnosis not present

## 2017-01-26 NOTE — Progress Notes (Signed)
Patient ID: Greg Patrick, male   DOB: 10/17/1980, 37 y.o.   MRN: [id number]                                                                                                               Reason for Appointment:  Hyperthyroidism, follow-up    History of Present Illness:   Background history: Since about 2-3 years ago patient  had symptoms of shakiness, feeling excessively hot and sweaty, some palpitations and fatigue. Although he thinks he lost only 5 pounds he thinks his weight was about 170 pounds prior to his symptoms; in May his weight was recorded as 148 He was admitted to the hospital with chest pain and shortness of breath but was found to be tachycardic and hyperthyroid He was started on methimazole in 05/2014 at the hospital but he was finally seen in follow-up in July His methimazole was restarted in 10/16 when he again had a marked increase in free T4 level, previously had become hypothyroid with 15 mg methimazole  Recent history:  He had been on variable doses of methimazole in 2017 Thyrotropin receptor antibody in 6/17  was only mildly increased at  2.1 and was normal as of 9/17   Because of his thyroid levels starting to increase again in 2018 he had I-131 treatment on 04/16/16 with 24.7 mCi His thyroid enlargement was at least 4 times normal at baseline Subsequently even without methimazole his free T4 level had dropped down to 0.22 in June and he had gained some weight He was then started on levothyroxine 137 g daily.  Because of high free T4 level subsequently this was reduced to 25 g  However in 09/2016 he had become frankly hyperthyroid again although he had minimal symptoms, did have some weight loss He was started on methimazole again but even with 10 mg a day he is thyroid levels were very low in 12/18  Currently not on any antithyroid drugs He feels fairly good except for periodically getting warm especially at work Sometimes may feel cold also in his hands and  feet  He has lost some weight, previously had gained some No palpitations or shakiness  Labs show his free T4 level has gone back up to 2.7 compared to 0.33   Wt Readings from Last 3 Encounters:  01/26/17 179 lb (81.2 kg)  12/15/16 186 lb (84.4 kg)  09/15/16 172 lb 9.6 oz (78.3 kg)     Lab Results  Component Value Date   TSH <0.01 (L) 01/21/2017   TSH 7.12 (H) 12/06/2016   TSH <0.01 (L) 09/13/2016   FREET4 2.30 (H) 01/21/2017   FREET4 0.33 (L) 12/06/2016   FREET4 2.74 (H) 09/13/2016   Lab Results  Component Value Date   T3FREE 2.7 12/06/2016   T3FREE 9.4 (H) 03/26/2016   T3FREE 3.9 08/04/2015      Allergies as of 01/26/2017      Reactions   Bee Venom Anaphylaxis   Fish-derived Products Anaphylaxis      Medication List  Accurate as of 01/26/17  2:21 PM. Always use your most recent med list.          aspirin EC 81 MG tablet Take 1 tablet (81 mg total) by mouth daily.   methimazole 10 MG tablet Commonly known as:  TAPAZOLE Take 1 tablet (10 mg total) by mouth 2 (two) times daily.   triamterene-hydrochlorothiazide 37.5-25 MG tablet Commonly known as:  MAXZIDE-25 Take 0.5 tablets by mouth daily.           Past Medical History:  Diagnosis Date  . Asthma   . Pancreatitis   . 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  Family History  Problem Relation Age of Onset  . Deep vein thrombosis Mother   . Hypertension Father     Social History:  reports that he has been smoking cigarettes.  he has never used smokeless tobacco. He reports that he drinks alcohol. He reports that he does not use drugs.  Allergies:  Allergies  Allergen Reactions  . Bee Venom Anaphylaxis  . Fish-Derived Products Anaphylaxis    Review of Systems:      HYPERTENSION: His blood pressure has been consistently high and since 8/18 has been taking 1 tablet of Maxzide daily He was told to increase it to the full tablet in  12/18 with some improvement in blood pressure  BP Readings from Last 3 Encounters:  01/26/17 (!) 142/86  12/15/16 (!) 152/94  09/15/16 (!) 148/92      Examination:   BP (!) 142/86   Pulse 77   Ht 5\' 11"  (1.803 m)   Wt 179 lb (81.2 kg)   SpO2 97%   BMI 24.97 kg/m     The thyroid is enlarged about 2-2.5 times normal on the right, Smooth,  Thyroid is enlarged about 1-1/2 times normal on the left, slightly firm Deep tendon reflexes  aren't brisk No tremor No eye signs   Assessment/Plan:   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, treated with I-131  His thyroid levels have been fluctuating over the last year He had been hypothyroid but subsequently became hyperthyroid again in September; with methimazole he became hypothyroid again  Since his thyroid enlargement is persisting and he is having an increased thyrotropin receptor antibody he will need to be treated with I-131 Difficult to regulate him on methimazole alone He is not very symptomatic although has lost weight  Again discussed the procedure for doing the I-131 treatment which will be ordered now    Hypertension: Blood pressure is slightly better with Maxzide.  Tablet and he will continue this Blood pressure may improve with control of hyperthyroidism also  There are no Patient Instructions on file for this visit.   Reather LittlerAjay Tricia Oaxaca 01/26/2017, 2:21 PM   Note: This office note was prepared with Dragon voice recognition system technology. Any transcriptional errors that result from this process are unintentional.

## 2017-02-21 ENCOUNTER — Encounter (HOSPITAL_COMMUNITY)
Admission: RE | Admit: 2017-02-21 | Discharge: 2017-02-21 | Disposition: A | Discharge status: Home / Self Care | Payer: 59 | Source: Ambulatory Visit | Attending: Endocrinology | Admitting: Endocrinology

## 2017-02-21 DIAGNOSIS — E0590 Thyrotoxicosis, unspecified without thyrotoxic crisis or storm: Secondary | ICD-10-CM | POA: Diagnosis present

## 2017-02-21 MED ORDER — SODIUM IODIDE I 131 CAPSULE
10.1000 | Freq: Once | INTRAVENOUS | Status: AC | PRN
  Administered 2017-02-21: 10.1 via ORAL

## 2017-02-22 ENCOUNTER — Other Ambulatory Visit: Payer: Self-pay | Admitting: Endocrinology

## 2017-02-22 ENCOUNTER — Encounter (HOSPITAL_COMMUNITY)
Admission: RE | Admit: 2017-02-22 | Discharge: 2017-02-22 | Disposition: A | Discharge status: Home / Self Care | Payer: 59 | Source: Ambulatory Visit | Attending: Endocrinology | Admitting: Endocrinology

## 2017-02-22 DIAGNOSIS — E0500 Thyrotoxicosis with diffuse goiter without thyrotoxic crisis or storm: Secondary | ICD-10-CM

## 2017-02-22 NOTE — Progress Notes (Signed)
I 131 ordered

## 2017-02-23 ENCOUNTER — Telehealth: Payer: Self-pay | Admitting: Endocrinology

## 2017-02-23 NOTE — Telephone Encounter (Signed)
Please advise on below and if the pt needs to contact another office

## 2017-02-23 NOTE — Telephone Encounter (Signed)
Patient is calling on the status of his radiation treatments, would like to know what to do next? Please advise

## 2017-02-23 NOTE — Telephone Encounter (Signed)
I had sent the order for treatment this week to nuclear medicine and he will need to confirm with them

## 2017-02-24 NOTE — Telephone Encounter (Signed)
Pt would like to know if he can restart his medication levothyroxine, or should he wait?

## 2017-02-24 NOTE — Telephone Encounter (Signed)
Pt is aware he is calling hospital to schedule he is not feeling well being off his medication and is wanting to have this all done as soon as possible

## 2017-02-24 NOTE — Telephone Encounter (Signed)
He cannot start methimazole until 2 days after the radioactive treatment Needs to make sure that the treatment is being done within the next week

## 2017-03-04 ENCOUNTER — Other Ambulatory Visit: Payer: 59

## 2017-03-09 ENCOUNTER — Ambulatory Visit: Payer: 59 | Admitting: Endocrinology

## 2017-03-09 DIAGNOSIS — Z0289 Encounter for other administrative examinations: Secondary | ICD-10-CM

## 2017-04-07 ENCOUNTER — Telehealth: Payer: Self-pay | Admitting: Endocrinology

## 2017-04-07 NOTE — Telephone Encounter (Signed)
Received staff message from nuclear medicine:Marland Kitchen.  Rhae HammockGraves, Carrielelia E  Achilles Neville, MD        If you are still needing the Nuclear Medicine Therapy for this patient  Please call the department Directly at [phone]    Please let them know that they need to do the treatment with 20 mCi as ordered. Also let patient know that  He will stop the methimazole 5 days before the treatment and restart 3 days later. Has appointment is for later this month but I need to see him 3 weeks after the actual treatment is done and this needs to be coordinated

## 2017-04-08 NOTE — Telephone Encounter (Signed)
Pt informed of below. He will call and schedule and call us back to coordinate his f/u with you.

## 2017-04-29 ENCOUNTER — Other Ambulatory Visit: Payer: 59

## 2017-05-04 ENCOUNTER — Ambulatory Visit: Payer: 59 | Admitting: Endocrinology

## 2017-05-04 DIAGNOSIS — Z0289 Encounter for other administrative examinations: Secondary | ICD-10-CM

## 2017-08-17 ENCOUNTER — Other Ambulatory Visit: Payer: Self-pay

## 2017-08-17 ENCOUNTER — Ambulatory Visit (HOSPITAL_COMMUNITY)
Admission: EM | Admit: 2017-08-17 | Discharge: 2017-08-17 | Disposition: A | Discharge status: Home / Self Care | Payer: 59 | Attending: Family Medicine | Admitting: Family Medicine

## 2017-08-17 ENCOUNTER — Encounter (HOSPITAL_COMMUNITY): Payer: Self-pay | Admitting: Emergency Medicine

## 2017-08-17 DIAGNOSIS — J0100 Acute maxillary sinusitis, unspecified: Secondary | ICD-10-CM

## 2017-08-17 MED ORDER — AMOXICILLIN-POT CLAVULANATE 875-125 MG PO TABS: 1.0000 | ORAL_TABLET | Freq: Two times a day (BID) | ORAL | 0 refills | Status: DC

## 2017-08-17 NOTE — ED Triage Notes (Signed)
Pt states he has been feeling sinus pressure since last Thursday.  On Sunday the pressure got worse.  He has been taking Allegra.

## 2017-08-17 NOTE — ED Provider Notes (Signed)
[facility]   [id number] 08/17/17 Arrival Time: 1238  ASSESSMENT & PLAN:  1. Acute non-recurrent maxillary sinusitis     Meds ordered this encounter  Medications  . amoxicillin-clavulanate (AUGMENTIN) 875-125 MG tablet    Sig: Take 1 tablet by mouth every 12 (twelve) hours.    Dispense:  20 tablet    Refill:  0   Discussed typical duration of symptoms. OTC symptom care as needed. May f/u with PCP or here as needed.  Reviewed expectations re: course of current medical issues. Questions answered. Outlined signs and symptoms indicating need for more acute intervention. Patient verbalized understanding. After Visit Summary given.   SUBJECTIVE: History from: patient.  Shawnie PonsMichael Tindall is a 37 y.o. male who presents with complaint of nasal congestion, post-nasal drainage, and R-sided sinus pain. Also feels pain in teeth on upper R. Onset gradual, greater than 1 week ago. Respiratory symptoms: none Fever: no. Overall normal PO intake without n/v. OTC treatment: Allegra; questions some help. History of frequent sinus infections: no. Social History   Tobacco Use  Smoking Status Current Every Day Smoker  . Types: Cigarettes  Smokeless Tobacco Never Used    ROS: As per HPI.   OBJECTIVE:  Vitals:   08/17/17 1248  BP: 138/88  Pulse: 72  Resp: 16  Temp: 98.4 F (36.9 C)  TempSrc: Oral  SpO2: 100%     General appearance: alert; appears fatigued HEENT: nasal congestion; clear runny nose; throat irritation secondary to post-nasal drainage; R-sided maxillary tenderness to palpation; turbinates boggy Neck: supple without LAD Lungs: unlabored respirations, symmetrical air entry; cough: absent; no respiratory distress Skin: warm and dry Psychological: alert and cooperative; normal mood and affect  Allergies  Allergen Reactions  . Bee Venom Anaphylaxis  . Fish-Derived Products Anaphylaxis    Past Medical History:  Diagnosis Date  . Asthma   . Pancreatitis     . Thyroid disease    Hyperthyroid secondary to goiter   Family History  Problem Relation Age of Onset  . Deep vein thrombosis Mother   . Hypertension Father    Social History   Socioeconomic History  . Marital status: Single    Spouse name: Not on file  . Number of children: Not on file  . Years of education: Not on file  . Highest education level: Not on file  Occupational History  . Not on file  Social Needs  . Financial resource strain: Not on file  . Food insecurity:    Worry: Not on file    Inability: Not on file  . Transportation needs:    Medical: Not on file    Non-medical: Not on file  Tobacco Use  . Smoking status: Current Every Day Smoker    Types: Cigarettes  . Smokeless tobacco: Never Used  Substance and Sexual Activity  . Alcohol use: Yes    Alcohol/week: 0.0 standard drinks    Comment: Socially  . Drug use: No  . Sexual activity: Not on file  Lifestyle  . Physical activity:    Days per week: Not on file    Minutes per session: Not on file  . Stress: Not on file  Relationships  . Social connections:    Talks on phone: Not on file    Gets together: Not on file    Attends religious service: Not on file    Active member of club or organization: Not on file    Attends meetings of clubs or organizations: Not on file  Relationship status: Not on file  . Intimate partner violence:    Fear of current or ex partner: Not on file    Emotionally abused: Not on file    Physically abused: Not on file    Forced sexual activity: Not on file  Other Topics Concern  . Not on file  Social History Narrative  . Not on file            Mardella LaymanHagler, Jullia Mulligan, MD 08/17/17 [phone]

## 2019-04-03 ENCOUNTER — Emergency Department (HOSPITAL_COMMUNITY)
Admission: EM | Admit: 2019-04-03 | Discharge: 2019-04-03 | Disposition: A | Discharge status: Home / Self Care | Payer: 59 | Attending: Emergency Medicine | Admitting: Emergency Medicine

## 2019-04-03 ENCOUNTER — Encounter (HOSPITAL_COMMUNITY): Payer: Self-pay

## 2019-04-03 ENCOUNTER — Other Ambulatory Visit: Payer: Self-pay

## 2019-04-03 ENCOUNTER — Emergency Department (HOSPITAL_COMMUNITY): Payer: 59

## 2019-04-03 DIAGNOSIS — N3091 Cystitis, unspecified with hematuria: Secondary | ICD-10-CM | POA: Diagnosis not present

## 2019-04-03 DIAGNOSIS — J45909 Unspecified asthma, uncomplicated: Secondary | ICD-10-CM | POA: Insufficient documentation

## 2019-04-03 DIAGNOSIS — Z7982 Long term (current) use of aspirin: Secondary | ICD-10-CM | POA: Insufficient documentation

## 2019-04-03 DIAGNOSIS — Z79899 Other long term (current) drug therapy: Secondary | ICD-10-CM | POA: Insufficient documentation

## 2019-04-03 DIAGNOSIS — Z87891 Personal history of nicotine dependence: Secondary | ICD-10-CM | POA: Diagnosis not present

## 2019-04-03 DIAGNOSIS — R319 Hematuria, unspecified: Secondary | ICD-10-CM | POA: Diagnosis present

## 2019-04-03 DIAGNOSIS — R1030 Lower abdominal pain, unspecified: Secondary | ICD-10-CM | POA: Insufficient documentation

## 2019-04-03 LAB — URINALYSIS, ROUTINE W REFLEX MICROSCOPIC
Bacteria, UA: NONE SEEN
Bilirubin Urine: NEGATIVE
Glucose, UA: NEGATIVE mg/dL
Ketones, ur: NEGATIVE mg/dL
Nitrite: NEGATIVE
Protein, ur: NEGATIVE mg/dL
RBC / HPF: >50 RBC/hpf — ABNORMAL HIGH (ref 0–5)
Specific Gravity, Urine: 1.011 (ref 1.005–1.030)
pH: 8 (ref 5.0–8.0)

## 2019-04-03 LAB — LIPASE, BLOOD: Lipase: 19 U/L (ref 11–51)

## 2019-04-03 LAB — CBC WITH DIFFERENTIAL/PLATELET
Abs Immature Granulocytes: 0.01 10*3/uL (ref 0.00–0.07)
Basophils Absolute: 0 10*3/uL (ref 0.0–0.1)
Basophils Relative: 1 %
Eosinophils Absolute: 0.2 10*3/uL (ref 0.0–0.5)
Eosinophils Relative: 6 %
HCT: 48.9 % (ref 39.0–52.0)
Hemoglobin: 16 g/dL (ref 13.0–17.0)
Immature Granulocytes: 0 %
Lymphocytes Relative: 27 %
Lymphs Abs: 1 10*3/uL (ref 0.7–4.0)
MCH: 30.2 pg (ref 26.0–34.0)
MCHC: 32.7 g/dL (ref 30.0–36.0)
MCV: 92.3 fL (ref 80.0–100.0)
Monocytes Absolute: 0.4 10*3/uL (ref 0.1–1.0)
Monocytes Relative: 11 %
Neutro Abs: 2.2 10*3/uL (ref 1.7–7.7)
Neutrophils Relative %: 55 %
Platelets: 196 10*3/uL (ref 150–400)
RBC: 5.3 MIL/uL (ref 4.22–5.81)
RDW: 12.2 % (ref 11.5–15.5)
WBC: 3.9 10*3/uL — ABNORMAL LOW (ref 4.0–10.5)
nRBC: 0 % (ref 0.0–0.2)

## 2019-04-03 LAB — COMPREHENSIVE METABOLIC PANEL
ALT: 33 U/L (ref 0–44)
AST: 26 U/L (ref 15–41)
Albumin: 4.4 g/dL (ref 3.5–5.0)
Alkaline Phosphatase: 99 U/L (ref 38–126)
Anion gap: 8 (ref 5–15)
BUN: 11 mg/dL (ref 6–20)
CO2: 27 mmol/L (ref 22–32)
Calcium: 9.1 mg/dL (ref 8.9–10.3)
Chloride: 107 mmol/L (ref 98–111)
Creatinine, Ser: 0.98 mg/dL (ref 0.61–1.24)
GFR calc Af Amer: >60 mL/min (ref >60)
GFR calc non Af Amer: >60 mL/min (ref >60)
Glucose, Bld: 98 mg/dL (ref 70–99)
Potassium: 4.5 mmol/L (ref 3.5–5.1)
Sodium: 142 mmol/L (ref 135–145)
Total Bilirubin: 1 mg/dL (ref 0.3–1.2)
Total Protein: 7.9 g/dL (ref 6.5–8.1)

## 2019-04-03 MED ORDER — MORPHINE SULFATE (PF) 4 MG/ML IV SOLN
4.0000 mg | Freq: Once | INTRAVENOUS | Status: AC
  Administered 2019-04-03: 12:00:00 4 mg via INTRAVENOUS
  Filled 2019-04-03: qty 1

## 2019-04-03 MED ORDER — SODIUM CHLORIDE 0.9 % IV BOLUS
1000.0000 mL | Freq: Once | INTRAVENOUS | Status: AC
  Administered 2019-04-03: 1000 mL via INTRAVENOUS

## 2019-04-03 MED ORDER — CEPHALEXIN 500 MG PO CAPS: 500.0000 mg | ORAL_CAPSULE | Freq: Two times a day (BID) | ORAL | 0 refills | Status: AC

## 2019-04-03 NOTE — Discharge Instructions (Signed)
I suspect that you have a urinary tract infection however recommend that you discuss with urology about any need for further work-up especially if hematuria does not improve.

## 2019-04-03 NOTE — ED Triage Notes (Signed)
Patient c/o right flank pain and hematuria since yesterday.

## 2019-04-03 NOTE — ED Provider Notes (Signed)
[facility] DEPT Provider Note   CSN: [id number] Arrival date & time: 04/03/19  1051     History Chief Complaint  Patient presents with  . Flank Pain  . Hematuria    Greg Patrick is a 39 y.o. male.  The history is provided by the patient.  Hematuria This is a new problem. The current episode started 2 days ago. The problem occurs daily. The problem has been gradually improving. Associated symptoms include abdominal pain (bilateral flank pain ). Pertinent negatives include no chest pain, no headaches and no shortness of breath. Nothing aggravates the symptoms. Nothing relieves the symptoms. He has tried nothing for the symptoms. The treatment provided no relief.       Past Medical History:  Diagnosis Date  . Asthma   . Pancreatitis   . 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 Chest pain 05/25/2014  . Goiter 05/25/2014  . Chest tightness 05/25/2014  . Cardiomegaly 05/25/2014  . QT prolongation 05/25/2014  . Thyromegaly     History reviewed. No pertinent surgical history.     Family History  Problem Relation Age of Onset  . Deep vein thrombosis Mother   . Hypertension Father     Social History   Tobacco Use  . Smoking status: Former Smoker    Types: Cigarettes  . Smokeless tobacco: Never Used  Substance Use Topics  . Alcohol use: Yes    Alcohol/week: 0.0 standard drinks    Comment: Socially  . Drug use: No    Home Medications Prior to Admission medications   Medication Sig Start Date End Date Taking? Authorizing Provider  ibuprofen (ADVIL) 200 MG tablet Take 200-400 mg by mouth daily as needed for moderate pain.   Yes [provider]  amoxicillin-clavulanate (AUGMENTIN) 875-125 MG tablet Take 1 tablet by mouth every 12 (twelve) hours. Patient not taking: Reported on 04/03/2019 08/17/17   Vanessa Kick, MD  aspirin EC 81 MG tablet Take 1 tablet (81 mg total) by mouth daily. Patient not taking: Reported on 04/03/2019 05/26/14   Dhungel, Flonnie Overman, MD  cephALEXin (KEFLEX) 500 MG capsule Take 1 capsule (500 mg total) by mouth 2 (two) times daily for 10 days. 04/03/19 04/13/19  Azariel Banik, DO  methimazole (TAPAZOLE) 10 MG tablet Take 1 tablet (10 mg total) by mouth 2 (two) times daily. Patient not taking: Reported on 04/03/2019 09/15/16   Elayne Snare, MD  triamterene-hydrochlorothiazide (MAXZIDE-25) 37.5-25 MG tablet Take 0.5 tablets by mouth daily. Patient not taking: Reported on 04/03/2019 09/24/16   Elayne Snare, MD    Allergies    Bee venom and Fish-derived products  Review of Systems   Review of Systems  Constitutional: Negative for chills and fever.  HENT: Negative for ear pain and sore throat.   Eyes: Negative for pain and visual disturbance.  Respiratory: Negative for cough and shortness of breath.   Cardiovascular: Negative for chest pain and palpitations.  Gastrointestinal: Positive for abdominal pain (bilateral flank pain ). Negative for vomiting.  Genitourinary: Positive for flank pain and hematuria. Negative for decreased urine volume, difficulty urinating, discharge, dysuria, enuresis, frequency, genital sores, penile pain, penile swelling, scrotal swelling, testicular pain and urgency.  Musculoskeletal: Negative for arthralgias and back pain.  Skin: Negative for color change and rash.  Neurological: Negative for seizures, syncope and headaches.  All other systems reviewed and are negative.  Physical Exam Updated Vital Signs BP 139/83   Pulse 64   Temp 98.4 F (36.9 C) (Oral)   Resp 19   Ht 5\' 11"  (1.803 m)   Wt 77.1 kg   SpO2 100%   BMI 23.71 kg/m   Physical Exam Vitals and nursing note reviewed.  Constitutional:      General: He is not in acute distress.    Appearance: He is well-developed. He is not ill-appearing.  HENT:     Head: Normocephalic and  atraumatic.     Nose: Nose normal.     Mouth/Throat:     Mouth: Mucous membranes are moist.  Eyes:     Extraocular Movements: Extraocular movements intact.     Conjunctiva/sclera: Conjunctivae normal.     Pupils: Pupils are equal, round, and reactive to light.  Cardiovascular:     Rate and Rhythm: Normal rate and regular rhythm.     Heart sounds: No murmur.  Pulmonary:     Effort: Pulmonary effort is normal. No respiratory distress.     Breath sounds: Normal breath sounds.  Abdominal:     General: Abdomen is flat. There is no distension.     Palpations: Abdomen is soft. There is no mass.     Tenderness: There is no abdominal tenderness. There is left CVA tenderness. There is no guarding or rebound.     Hernia: No hernia is present.  Musculoskeletal:     Cervical back: Neck supple.     Right lower leg: No edema.     Left lower leg: No edema.  Skin:    General: Skin is warm and dry.     Capillary Refill: Capillary refill takes less than 2 seconds.  Neurological:     General: No focal deficit present.     Mental Status: He is alert.     ED Results / Procedures / Treatments   Labs (all labs ordered are listed, but only abnormal results are displayed) Labs Reviewed  URINALYSIS, ROUTINE W REFLEX MICROSCOPIC - Abnormal; Notable for the following components:      Result Value   Hgb urine dipstick LARGE (*)    Leukocytes,Ua SMALL (*)    RBC / HPF >50 (*)    All other components within normal limits  CBC WITH DIFFERENTIAL/PLATELET - Abnormal; Notable for the following components:   WBC 3.9 (*)    All other components within normal limits  COMPREHENSIVE METABOLIC PANEL  LIPASE, BLOOD  GC/CHLAMYDIA PROBE AMP ([place]) NOT AT [facility]    EKG None  Radiology CT Renal Stone Study  Result Date: 04/03/2019 CLINICAL DATA:  Right flank pain and hematuria EXAM: CT ABDOMEN AND PELVIS WITHOUT CONTRAST TECHNIQUE: Multidetector CT imaging of the abdomen and pelvis was performed  following the standard protocol without oral or IV contrast. COMPARISON:  June 14, 2013 FINDINGS: Lower chest: There is slight atelectasis in the posterior left base. Lung bases otherwise are clear. Heart is enlarged but stable. Hepatobiliary: No focal liver lesions are evident on this noncontrast enhanced study. Gallbladder wall is not appreciably thickened. There is no biliary duct dilatation. Pancreas: There is no pancreatic mass or inflammatory focus. Spleen: No splenic lesions are appreciable. Adrenals/Urinary Tract: Adrenals bilaterally appear unremarkable. There is no evident renal mass or hydronephrosis on either side. There is no evident renal or ureteral calculus on either side. Urinary bladder is midline borderline wall thickening. Stomach/Bowel: There are multiple sigmoid diverticula without diverticulitis. There is no appreciable bowel wall or mesenteric thickening.  There is no evident bowel obstruction. The terminal ileum appears normal. There is no evident free air or portal venous air. Vascular/Lymphatic: No abdominal aortic aneurysm. No vascular lesions evident on this noncontrast enhanced study. There are multiple subcentimeter mesenteric lymph nodes. There are no lymph nodes in the abdomen or pelvis which meet size criteria for pathologic significance. Reproductive: Prostate and seminal vesicles are normal in size and contour. No evident pelvic mass. Other: Appendix appears normal. No abscess or ascites is evident in the abdomen or pelvis. Musculoskeletal: No blastic or lytic bone lesions. No intramuscular or abdominal wall lesions evident. IMPRESSION: 1. Borderline urinary bladder wall thickening. Question a degree of cystitis. Advise correlation with urinalysis in this regard. 2. No evident renal or ureteral calculus. No hydronephrosis on either side. 3. Sigmoid diverticula without diverticulitis. No bowel wall thickening or bowel obstruction. No abscess in the abdomen or pelvis. Appendix  appears normal. 4.  Stable cardiac prominence. Electronically Signed   By: Bretta Bang III M.D.   On: 04/03/2019 13:54    Procedures Procedures (including critical care time)  Medications Ordered in ED Medications  sodium chloride 0.9 % bolus 1,000 mL (1,000 mLs Intravenous New Bag/Given 04/03/19 1223)  morphine 4 MG/ML injection 4 mg (4 mg Intravenous Given 04/03/19 1221)    ED Course  I have reviewed the triage vital signs and the nursing notes.  Pertinent labs & imaging results that were available during my care of the patient were reviewed by me and considered in my medical decision making (see chart for details).    MDM Rules/Calculators/A&P                      Chaka Jefferys is a 39 year old male with no significant medical history presents the ED with hematuria, flank pain.  Patient with overall unremarkable vitals.  No fever.  Patient with symptoms for the last several days.  Has had gross hematuria for the last 2 days.  No history of kidney stones.  States that he has had bilateral flank pain.  States that hematuria has improved today.  No urinary retention.  No abdominal pain anteriorly on exam.  Has some left CVA tenderness.  Denies any increased physical activity or physical stress.  Possibly musculoskeletal process versus kidney stone versus UTI.  Urinalysis shows hematuria but no bacteria.  CT scan overall unremarkable.  No kidney stone.  However bladder wall is thickened and possibly cystitis.  Otherwise no significant leukocytosis, anemia, electrolyte abnormality.  Will treat for hemorrhagic cystitis with antibiotics however will have a follow-up with urology in case he may need further work-up.  He is not a smoker.  However he has fairly painless hematuria and may need cystoscopy.  Given return precautions and discharged in the ED in good condition.  This chart was dictated using voice recognition software.  Despite best efforts to proofread,  errors can occur which can  change the documentation meaning.   Final Clinical Impression(s) / ED Diagnoses Final diagnoses:  Hemorrhagic cystitis    Rx / DC Orders ED Discharge Orders         Ordered    cephALEXin (KEFLEX) 500 MG capsule  2 times daily     04/03/19 1439           Virgina Norfolk, DO 04/03/19 1443

## 2019-04-04 LAB — GC/CHLAMYDIA PROBE AMP (~~LOC~~) NOT AT ARMC
Chlamydia: NEGATIVE
Neisseria Gonorrhea: NEGATIVE

## 2020-10-17 IMAGING — CT CT RENAL STONE PROTOCOL
2 of 4 series · 15 of 46 positions shown, 17 images · non-contrast
Comparison: June 14, 2013

CLINICAL DATA: Right flank pain and hematuria

EXAM:
CT ABDOMEN AND PELVIS WITHOUT CONTRAST
TECHNIQUE: Multidetector CT imaging of the abdomen and pelvis was performed
following the standard protocol without oral or IV contrast.

[Series 2: axial st · axial · 0.73mm/px · z∈[-445,-75]mm · 12 of 84 slices shown, 14 images]
[im 5/84  soft-tissue]
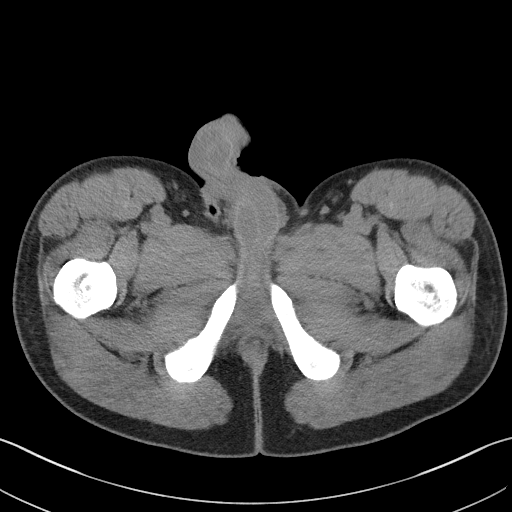
[im 5/84  bone]
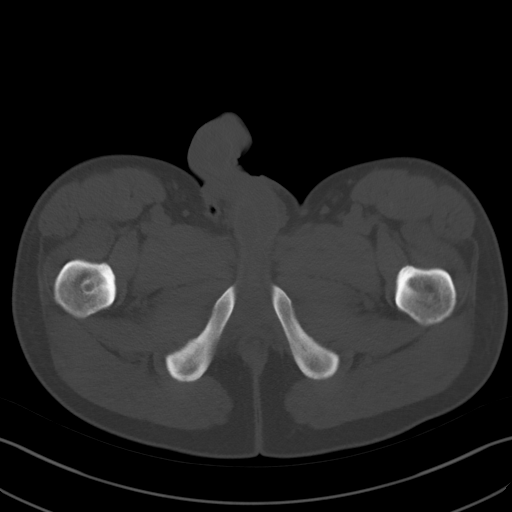
[im 13/84  soft-tissue]
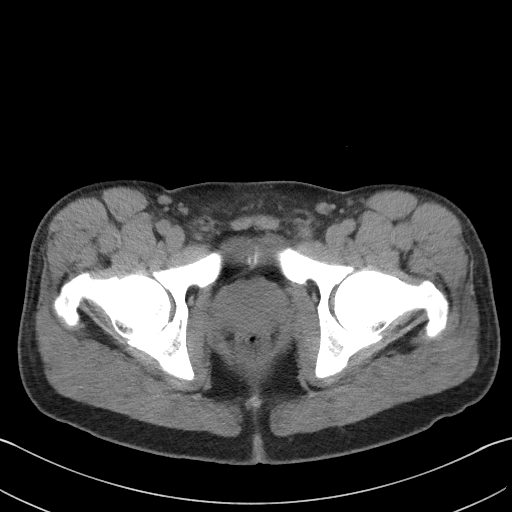
[im 17/84  soft-tissue]
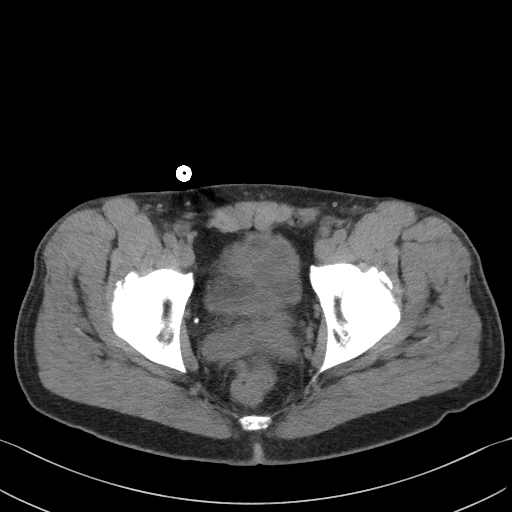
[im 25/84  soft-tissue]
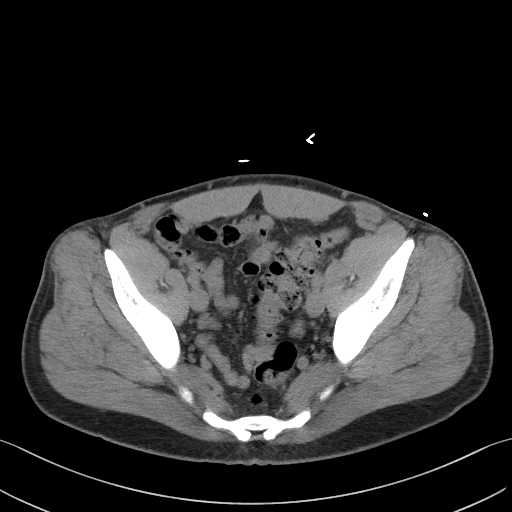
[im 34/84  soft-tissue]
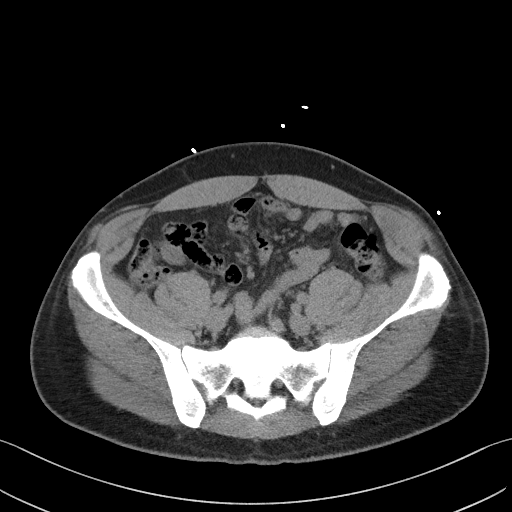
[im 38/84  soft-tissue]
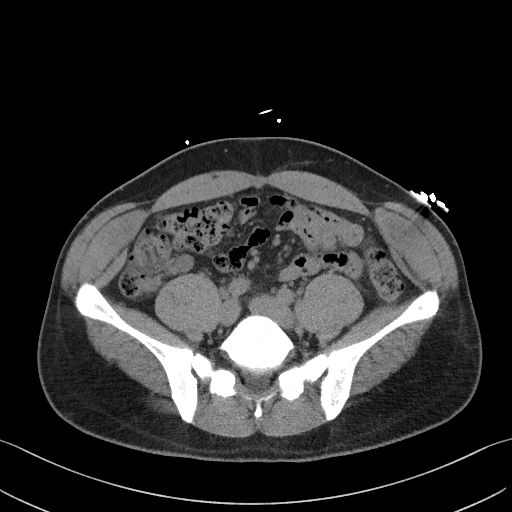
[im 46/84  soft-tissue]
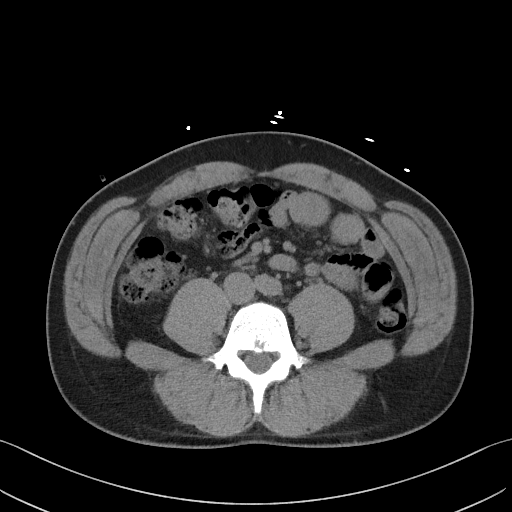
[im 50/84  soft-tissue]
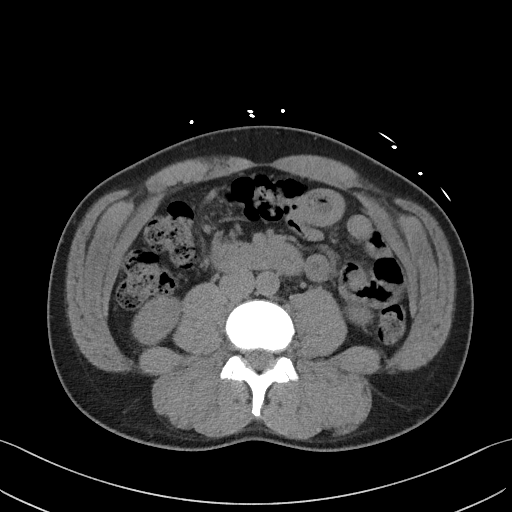
[im 59/84  soft-tissue]
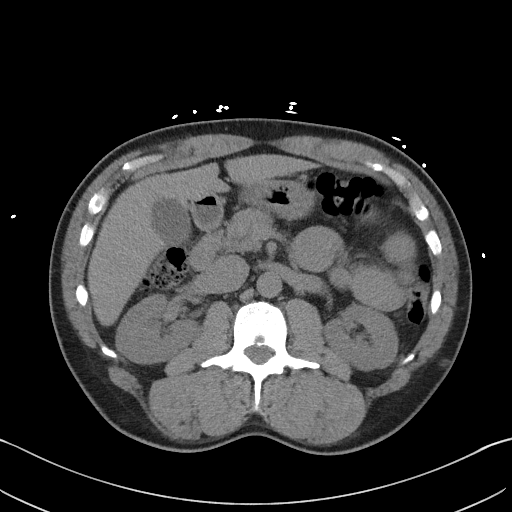
[im 59/84  bone]
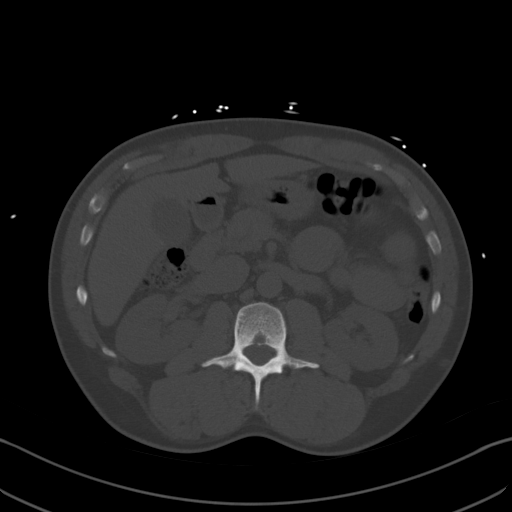
[im 67/84  soft-tissue]
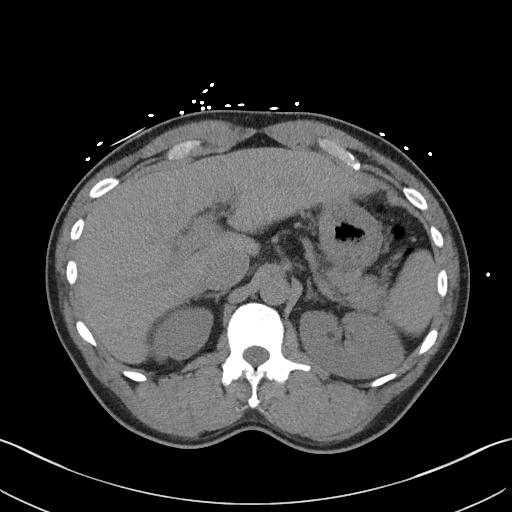
[im 71/84  soft-tissue]
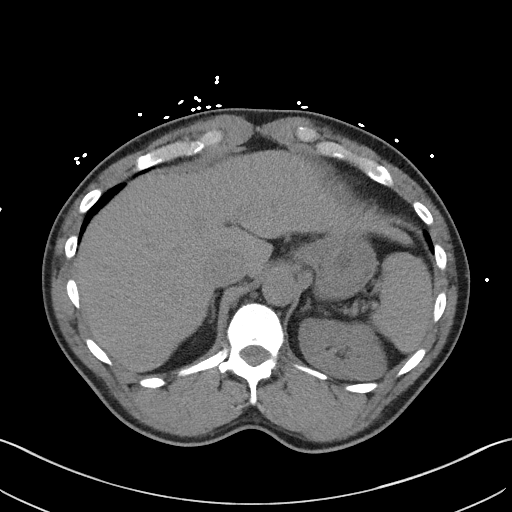
[im 79/84  soft-tissue]
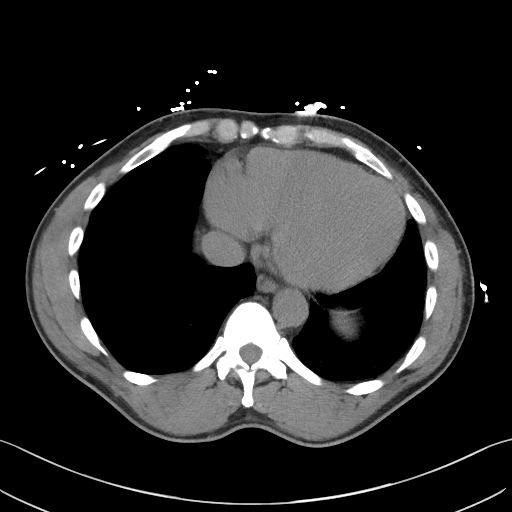

[Series 4: coronal st · coronal · 0.71mm/px · 3 of 101 slices shown]
[im 34/101  soft-tissue]
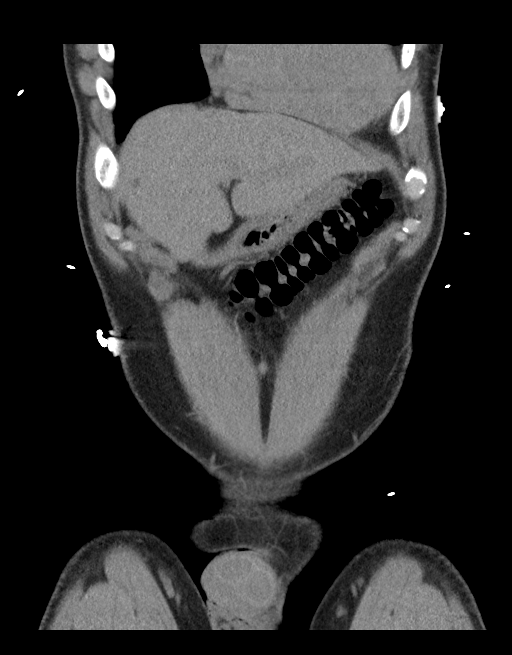
[im 45/101  soft-tissue]
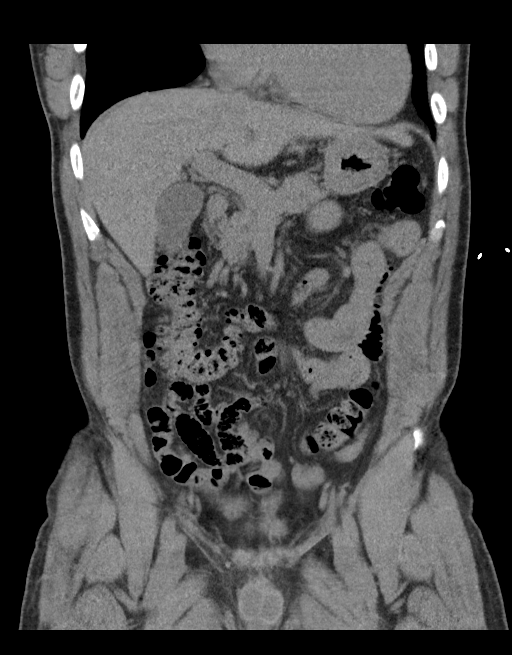
[im 56/101  soft-tissue]
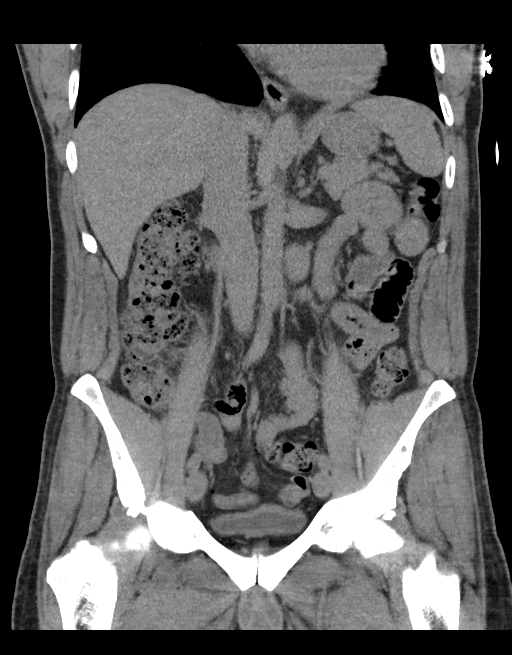

[15 of 46 positions shown; findings below may reference images not displayed]

FINDINGS: Lower chest: There is slight atelectasis in the posterior left base.
Lung bases otherwise are clear. Heart is enlarged but stable.

Hepatobiliary: No focal liver lesions are evident on this
noncontrast enhanced study. Gallbladder wall is not appreciably
thickened. There is no biliary duct dilatation.

Pancreas: There is no pancreatic mass or inflammatory focus.

Spleen: No splenic lesions are appreciable.

Adrenals/Urinary Tract: Adrenals bilaterally appear unremarkable.
There is no evident renal mass or hydronephrosis on either side.
There is no evident renal or ureteral calculus on either side.
Urinary bladder is midline borderline wall thickening.

Stomach/Bowel: There are multiple sigmoid diverticula without
diverticulitis. There is no appreciable bowel wall or mesenteric
thickening. There is no evident bowel obstruction. The terminal
ileum appears normal. There is no evident free air or portal venous
air.

Vascular/Lymphatic: No abdominal aortic aneurysm. No vascular
lesions evident on this noncontrast enhanced study. There are
multiple subcentimeter mesenteric lymph nodes. There are no lymph
nodes in the abdomen or pelvis which meet size criteria for
pathologic significance.

Reproductive: Prostate and seminal vesicles are normal in size and
contour. No evident pelvic mass.

Other: Appendix appears normal. No abscess or ascites is evident in
the abdomen or pelvis.

Musculoskeletal: No blastic or lytic bone lesions. No intramuscular
or abdominal wall lesions evident.
IMPRESSION: 1. Borderline urinary bladder wall thickening. Question a degree of
cystitis. Advise correlation with urinalysis in this regard.

2. No evident renal or ureteral calculus. No hydronephrosis on
either side.

3. Sigmoid diverticula without diverticulitis. No bowel wall
thickening or bowel obstruction. No abscess in the abdomen or
pelvis. Appendix appears normal.

4.  Stable cardiac prominence.

## 2021-11-20 ENCOUNTER — Ambulatory Visit
Admission: RE | Admit: 2021-11-20 | Discharge: 2021-11-20 | Disposition: A | Discharge status: Home / Self Care | Payer: Self-pay | Source: Ambulatory Visit | Attending: Internal Medicine | Admitting: Internal Medicine

## 2021-11-20 VITALS — BP 177/97 | HR 58 | Temp 97.8°F | Resp 19

## 2021-11-20 DIAGNOSIS — J0101 Acute recurrent maxillary sinusitis: Secondary | ICD-10-CM

## 2021-11-20 MED ORDER — AMOXICILLIN-POT CLAVULANATE 875-125 MG PO TABS: 1.0000 | ORAL_TABLET | Freq: Two times a day (BID) | ORAL | 0 refills | Status: DC

## 2021-11-20 NOTE — Discharge Instructions (Signed)
I am suspicious that you have a sinus infection so I am treating you with an antibiotic.  Please follow-up if symptoms persist or worsen.

## 2021-11-20 NOTE — ED Triage Notes (Signed)
Pt presents to [facility] with co of sinus pressure and pain in L sided of face. Pt reports pain is 12/10 and has been taking musinex, sudafed and motrin with minimal improvement. Pt reports he has had sinus infections before and this feels similar. Symptoms for 1 week.

## 2021-11-20 NOTE — ED Provider Notes (Signed)
[facility]    CSN: [id number] Arrival date & time: 11/20/21  1808      History   Chief Complaint Chief Complaint  Patient presents with   Nasal Congestion    I am sure that it's a sinus infection that is causing me a lot of pain on one half of my face, ear and mouth. - Entered by patient   Facial Pain    HPI Greg Patrick is a 41 y.o. male.   Patient presents with left-sided sinus pressure and nasal congestion that has been present for 1 week.  Patient reports that he has taken Sudafed and Motrin with no improvement.  He states that he has had a sinus infection in the past and this "feels similar".  Denies cough, chest pain, shortness of breath, nausea, vomiting, diarrhea, abdominal pain.  Denies any known sick contacts or fever.  Patient also has elevated blood pressure reading but reports that this is typical when he comes to the doctor's office.     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 No pertinent surgical history.     Home Medications    Prior to Admission medications   Medication Sig Start Date End Date Taking? Authorizing Provider  amoxicillin-clavulanate (AUGMENTIN) 875-125 MG tablet Take 1 tablet by mouth every 12 (twelve) hours. 11/20/21  Yes Gustavus Bryant, FNP  aspirin EC 81 MG tablet Take 1 tablet (81 mg total) by mouth daily. Patient not taking: Reported on 04/03/2019 05/26/14   Dhungel, Theda Belfast, MD  ibuprofen (ADVIL) 200 MG tablet Take 200-400 mg by mouth daily as needed for moderate pain.    [provider]  methimazole (TAPAZOLE) 10 MG tablet Take 1 tablet (10 mg total) by mouth 2  (two) times daily. Patient not taking: Reported on 04/03/2019 09/15/16   Reather Littler, MD  triamterene-hydrochlorothiazide (MAXZIDE-25) 37.5-25 MG tablet Take 0.5 tablets by mouth daily. Patient not taking: Reported on 04/03/2019 09/24/16   Reather Littler, MD    Family History Family History  Problem Relation Age of Onset   Deep vein thrombosis Mother    Hypertension Father     Social History Social History   Tobacco Use   Smoking status: Former    Packs/day: 0.25    Years: 25.00    Total pack years: 6.25    Types: Cigarettes    Quit date: 12/11/2020    Years since quitting: 0.9   Smokeless tobacco: Never  Vaping Use   Vaping Use: Never used  Substance Use Topics   Alcohol use: Yes    Alcohol/week: 2.0 standard drinks of alcohol    Types: 2 Shots of liquor per week    Comment: Socially   Drug use: No     Allergies   Bee venom and Fish-derived products   Review of Systems Review of Systems Per HPI  Physical Exam Triage Vital Signs ED Triage Vitals  Enc Vitals Group     BP 11/20/21 1823 (!) 177/97     Pulse Rate 11/20/21 1823 (!) 58     Resp 11/20/21 1823 19  Temp 11/20/21 1823 97.8 F (36.6 C)     Temp src --      SpO2 11/20/21 1823 98 %     Weight --      Height --      Head Circumference --      Peak Flow --      Pain Score 11/20/21 1821 10     Pain Loc --      Pain Edu? --      Excl. in GC? --    No data found.  Updated Vital Signs BP (!) 177/97   Pulse (!) 58   Temp 97.8 F (36.6 C)   Resp 19   SpO2 98%   Visual Acuity Right Eye Distance:   Left Eye Distance:   Bilateral Distance:    Right Eye Near:   Left Eye Near:    Bilateral Near:     Physical Exam Constitutional:      General: He is not in acute distress.    Appearance: Normal appearance. He is not toxic-appearing or diaphoretic.  HENT:     Head: Normocephalic and atraumatic.     Right Ear: Tympanic membrane and ear canal normal.     Left Ear: Tympanic membrane and ear canal  normal.     Nose: Congestion present.     Right Sinus: No maxillary sinus tenderness or frontal sinus tenderness.     Left Sinus: Maxillary sinus tenderness present. No frontal sinus tenderness.     Mouth/Throat:     Mouth: Mucous membranes are moist.     Pharynx: No posterior oropharyngeal erythema.  Eyes:     Extraocular Movements: Extraocular movements intact.     Conjunctiva/sclera: Conjunctivae normal.     Pupils: Pupils are equal, round, and reactive to light.  Cardiovascular:     Rate and Rhythm: Normal rate and regular rhythm.     Pulses: Normal pulses.     Heart sounds: Normal heart sounds.  Pulmonary:     Effort: Pulmonary effort is normal. No respiratory distress.     Breath sounds: Normal breath sounds. No stridor. No wheezing, rhonchi or rales.  Abdominal:     General: Abdomen is flat. Bowel sounds are normal.     Palpations: Abdomen is soft.  Musculoskeletal:        General: Normal range of motion.     Cervical back: Normal range of motion.  Skin:    General: Skin is warm and dry.  Neurological:     General: No focal deficit present.     Mental Status: He is alert and oriented to person, place, and time. Mental status is at baseline.     Cranial Nerves: Cranial nerves 2-12 are intact.     Sensory: Sensation is intact.     Motor: Motor function is intact.     Coordination: Coordination is intact.     Gait: Gait is intact.  Psychiatric:        Mood and Affect: Mood normal.        Behavior: Behavior normal.        Thought Content: Thought content normal.        Judgment: Judgment normal.      UC Treatments / Results  Labs (all labs ordered are listed, but only abnormal results are displayed) Labs Reviewed - No data to display  EKG   Radiology No results found.  Procedures Procedures (including critical care time)  Medications Ordered in UC Medications - No data to  display  Initial Impression / Assessment and Plan / UC Course  I have reviewed the  triage vital signs and the nursing notes.  Pertinent labs & imaging results that were available during my care of the patient were reviewed by me and considered in my medical decision making (see chart for details).     Physical exam is consistent with acute maxillary sinusitis.  Will treat with Augmentin antibiotic especially given duration of symptoms.  Patient advised to follow-up if symptoms persist or worsen.  Patient has elevated blood pressure reading with recheck being similar.  He states that this is baseline when he comes to the doctor's office.  He has also been taking Sudafed which could be contributing.  Patient advised to monitor blood pressure at home very closely with home blood pressure cuff and voiced understanding.  No signs of hypertensive urgency on exam as neuro exam is normal and no signs of endorgan damage.  Patient was given strict return and ER precautions.  Patient verbalized understanding and was agreeable with plan. Final Clinical Impressions(s) / UC Diagnoses   Final diagnoses:  Acute recurrent maxillary sinusitis     Discharge Instructions      I am suspicious that you have a sinus infection so I am treating you with an antibiotic.  Please follow-up if symptoms persist or worsen.    ED Prescriptions     Medication Sig Dispense Auth. Provider   amoxicillin-clavulanate (AUGMENTIN) 875-125 MG tablet Take 1 tablet by mouth every 12 (twelve) hours. 14 tablet [place], Acie Fredrickson, [place]      PDMP not reviewed this encounter.   Gustavus Bryant, [place] 11/20/21 Ernestina [place]

## 2021-12-04 ENCOUNTER — Encounter (HOSPITAL_COMMUNITY): Payer: Self-pay

## 2021-12-04 ENCOUNTER — Ambulatory Visit (HOSPITAL_COMMUNITY)
Admission: RE | Admit: 2021-12-04 | Discharge: 2021-12-04 | Disposition: A | Discharge status: Home / Self Care | Payer: Medicaid Other | Source: Ambulatory Visit | Attending: Physician Assistant | Admitting: Physician Assistant

## 2021-12-04 VITALS — BP 168/93 | HR 65 | Temp 98.3°F | Resp 16

## 2021-12-04 DIAGNOSIS — J329 Chronic sinusitis, unspecified: Secondary | ICD-10-CM

## 2021-12-04 DIAGNOSIS — R030 Elevated blood-pressure reading, without diagnosis of hypertension: Secondary | ICD-10-CM | POA: Diagnosis not present

## 2021-12-04 MED ORDER — PREDNISONE 20 MG PO TABS: 20.0000 mg | ORAL_TABLET | Freq: Every day | ORAL | 0 refills | Status: AC

## 2021-12-04 MED ORDER — DOXYCYCLINE HYCLATE 100 MG PO CAPS: 100.0000 mg | ORAL_CAPSULE | Freq: Two times a day (BID) | ORAL | 0 refills | Status: DC

## 2021-12-04 NOTE — Discharge Instructions (Signed)
Take doxycycline 100 mg twice daily for 10 days.  Stay out of the sun while on this medication as it can cause discomfort.  Take prednisone 20 mg for 4 days.  Do not take NSAIDs including aspirin, ibuprofen/Advil, naproxen/Aleve with this medication as can cause stomach bleeding.  Use Tylenol, Mucinex, Flonase for symptom relief.  If your symptoms or not improving please follow-up with ENT; call to schedule an appointment.  If anything worsens return for reevaluation.  Your blood pressure is elevated.  Please avoid decongestants, caffeine, sodium, NSAIDs (aspirin, ibuprofen/Advil, naproxen/Aleve).  Monitor your blood pressure.  If persistently above 140/90 you need to be reevaluated to consider medication.  Follow-up closely with your primary care.  If you develop any chest pain, shortness of breath, headache, vision change, dizziness in the setting of high blood pressure you need to go to the emergency room immediately.

## 2021-12-04 NOTE — ED Triage Notes (Signed)
Pt states he was seen here two weeks ago for sinus infection and pain to left side of face.  States four days ago the right side of his face started hurting. Finished antibiotics prescribed.

## 2021-12-04 NOTE — ED Provider Notes (Signed)
[facility]    CSN: [id number] Arrival date & time: 12/04/21  1753      History   Chief Complaint Chief Complaint  Patient presents with   Nasal Congestion    Entered by patient    HPI Braulio Kiedrowski is a 41 y.o. male.   Patient presents today with a 3 to 4-day worsening of sinus pressure/congestion.  He was seen on 11/20/2021 with sinus congestion at which point he was started on Augmentin.  This improved his symptoms but did not resolve them.  Reports that he was given a complete course of antibiotics at this time by the pharmacy but he did have some improvement so he did not try to receive any additional medication.  Denies additional antibiotic use in the past 90 days.  He has been using multiple over-the-counter medications including Tylenol, Mucinex, decongestants, allergy medicine.  He does have a history of allergies but this is generally well-managed with over-the-counter antihistamines.  He has a history of asthma but has not required albuterol inhaler anytime recently.  Does not smoke.  Denies any known sick contacts as he works from home.  Patient's blood pressure is noted to be elevated today.  He does report taking NSAIDs and decongestants.  He does not monitor his blood pressure at home.  Denies any headache, chest pain, shortness of breath, dizziness.    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 No pertinent surgical history.     Home Medications    Prior to Admission medications   Medication Sig Start Date End Date Taking? Authorizing Provider  doxycycline (VIBRAMYCIN) 100 MG capsule Take 1 capsule (100  mg total) by mouth 2 (two) times daily. 12/04/21  Yes Rojean Ige K, PA-C  predniSONE (DELTASONE) 20 MG tablet Take 1 tablet (20 mg total) by mouth daily for 4 days. 12/04/21 12/08/21 Yes Jamond Neels, Noberto Retort, PA-C  aspirin EC 81 MG tablet Take 1 tablet (81 mg total) by mouth daily. Patient not taking: Reported on 04/03/2019 05/26/14   Dhungel, Theda Belfast, MD  methimazole (TAPAZOLE) 10 MG tablet Take 1 tablet (10 mg total) by mouth 2 (two) times daily. Patient not taking: Reported on 04/03/2019 09/15/16   Reather Littler, MD  triamterene-hydrochlorothiazide (MAXZIDE-25) 37.5-25 MG tablet Take 0.5 tablets by mouth daily. Patient not taking: Reported on 04/03/2019 09/24/16   Reather Littler, MD    Family History Family History  Problem Relation Age of Onset   Deep vein thrombosis Mother    Hypertension Father     Social History Social History   Tobacco Use   Smoking status: Former    Packs/day: 0.25    Years: 25.00    Total pack years: 6.25    Types: Cigarettes    Quit date: 12/11/2020    Years since quitting: 0.9   Smokeless tobacco: Never  Vaping Use   Vaping Use: Never used  Substance Use Topics   Alcohol use: Yes    Alcohol/week: 2.0 standard drinks of alcohol    Types: 2 Shots of liquor per week    Comment: Socially   Drug use: No  Allergies   Bee venom and Fish-derived products   Review of Systems Review of Systems  Constitutional:  Positive for activity change. Negative for appetite change, fatigue and fever.  HENT:  Positive for congestion, postnasal drip, sinus pressure and sore throat. Negative for sneezing.   Respiratory:  Positive for cough. Negative for shortness of breath.   Cardiovascular:  Negative for chest pain.  Gastrointestinal:  Negative for abdominal pain, diarrhea, nausea and vomiting.  Neurological:  Positive for headaches. Negative for dizziness and light-headedness.     Physical Exam Triage Vital Signs ED Triage Vitals [12/04/21 1811]  Enc Vitals Group      BP (!) 166/95     Pulse Rate 65     Resp 16     Temp 98.3 F (36.8 C)     Temp Source Oral     SpO2 98 %     Weight      Height      Head Circumference      Peak Flow      Pain Score 10     Pain Loc      Pain Edu?      Excl. in GC?    No data found.  Updated Vital Signs BP (!) 168/93 (BP Location: Right Arm)   Pulse 65   Temp 98.3 F (36.8 C) (Oral)   Resp 16   SpO2 98%   Visual Acuity Right Eye Distance:   Left Eye Distance:   Bilateral Distance:    Right Eye Near:   Left Eye Near:    Bilateral Near:     Physical Exam Vitals reviewed.  Constitutional:      General: He is awake.     Appearance: Normal appearance. He is well-developed. He is not ill-appearing.     Comments: Very pleasant male appears stated age in no acute distress sitting comfortably in exam room  HENT:     Head: Normocephalic and atraumatic.     Right Ear: Ear canal and external ear normal. A middle ear effusion is present. Tympanic membrane is not erythematous or bulging.     Left Ear: Tympanic membrane, ear canal and external ear normal. Tympanic membrane is not erythematous or bulging.     Nose:     Right Sinus: Maxillary sinus tenderness present. No frontal sinus tenderness.     Left Sinus: Maxillary sinus tenderness present. No frontal sinus tenderness.     Mouth/Throat:     Pharynx: Uvula midline. Posterior oropharyngeal erythema present. No oropharyngeal exudate.     Comments: Erythema and drainage present posterior oropharynx Cardiovascular:     Rate and Rhythm: Normal rate and regular rhythm.     Heart sounds: Normal heart sounds, S1 normal and S2 normal. No murmur heard. Pulmonary:     Effort: Pulmonary effort is normal. No accessory muscle usage or respiratory distress.     Breath sounds: Normal breath sounds. No stridor. No wheezing, rhonchi or rales.     Comments: Clear to auscultation bilaterally Abdominal:     General: Bowel sounds are normal.     Palpations: Abdomen is  soft.     Tenderness: There is no abdominal tenderness.  Neurological:     Mental Status: He is alert.  Psychiatric:        Behavior: Behavior is cooperative.      UC Treatments / Results  Labs (all labs ordered are listed, but only abnormal results are displayed) Labs Reviewed - No data to display  EKG   Radiology No results found.  Procedures Procedures (including critical care time)  Medications Ordered in UC Medications - No data to display  Initial Impression / Assessment and Plan / UC Course  I have reviewed the triage vital signs and the nursing notes.  Pertinent labs & imaging results that were available during my care of the patient were reviewed by me and considered in my medical decision making (see chart for details).     Patient is well-appearing, afebrile, nontoxic, nontachycardic.  Given symptoms have been ongoing for several weeks with recent worsening concern for recurrent sinus infection versus incomplete treatment of previously diagnosed sinus infection.  Will treat with doxycycline 100 mg twice daily for 10 days.  Will start prednisone 20 mg for 4 days for additional symptom relief.  He was instructed to avoid NSAIDs with this medication.  Can use Mucinex and Flonase for additional symptom relief.  Recommended that he rest and drink plenty of fluid.  Discussed that if his symptoms are proving he should follow-up with an ENT and was given contact information for local provider with instruction to call to schedule an appointment.  If anything worsens and he has cough, shortness of breath, fever, nausea, vomiting he needs to be seen immediately.  Blood pressure is elevated.  Suspect this is a combination of illness and medications he has been taking including decongestants and NSAIDs.  He was instructed to avoid NSAIDs and decongestants as well as caffeine and sodium.  He is to monitor his blood pressure at home and if persistently above 140/90 he should be  reevaluated to consider initiation of medication.  Recommended close follow-up with his primary care.  If he has any worsening symptoms including headache, chest pain, shortness of breath, dizziness in the setting of high blood pressure he needs to go to the emergency room immediately.  Final Clinical Impressions(s) / UC Diagnoses   Final diagnoses:  Recurrent sinusitis  Elevated blood pressure reading     Discharge Instructions      Take doxycycline 100 mg twice daily for 10 days.  Stay out of the sun while on this medication as it can cause discomfort.  Take prednisone 20 mg for 4 days.  Do not take NSAIDs including aspirin, ibuprofen/Advil, naproxen/Aleve with this medication as can cause stomach bleeding.  Use Tylenol, Mucinex, Flonase for symptom relief.  If your symptoms or not improving please follow-up with ENT; call to schedule an appointment.  If anything worsens return for reevaluation.  Your blood pressure is elevated.  Please avoid decongestants, caffeine, sodium, NSAIDs (aspirin, ibuprofen/Advil, naproxen/Aleve).  Monitor your blood pressure.  If persistently above 140/90 you need to be reevaluated to consider medication.  Follow-up closely with your primary care.  If you develop any chest pain, shortness of breath, headache, vision change, dizziness in the setting of high blood pressure you need to go to the emergency room immediately.     ED Prescriptions     Medication Sig Dispense Auth. Provider   predniSONE (DELTASONE) 20 MG tablet Take 1 tablet (20 mg total) by mouth daily for 4 days. 4 tablet Janyth Riera K, PA-C   doxycycline (VIBRAMYCIN) 100 MG capsule Take 1 capsule (100 mg total) by mouth 2 (two) times daily. 20 capsule Tametria Aho, Noberto Retort, PA-C      PDMP not reviewed this encounter.   Jeani Hawking, PA-C 12/04/21 1830

## 2021-12-06 ENCOUNTER — Other Ambulatory Visit: Payer: Self-pay

## 2021-12-06 ENCOUNTER — Emergency Department (HOSPITAL_COMMUNITY)
Admission: EM | Admit: 2021-12-06 | Discharge: 2021-12-06 | Disposition: A | Discharge status: Home / Self Care | Payer: Medicaid Other | Attending: Emergency Medicine | Admitting: Emergency Medicine

## 2021-12-06 ENCOUNTER — Encounter (HOSPITAL_COMMUNITY): Payer: Self-pay

## 2021-12-06 DIAGNOSIS — Z7982 Long term (current) use of aspirin: Secondary | ICD-10-CM | POA: Diagnosis not present

## 2021-12-06 DIAGNOSIS — K0889 Other specified disorders of teeth and supporting structures: Secondary | ICD-10-CM | POA: Diagnosis not present

## 2021-12-06 DIAGNOSIS — K047 Periapical abscess without sinus: Secondary | ICD-10-CM | POA: Diagnosis not present

## 2021-12-06 MED ORDER — NAPROXEN 500 MG PO TABS: 500.0000 mg | ORAL_TABLET | Freq: Two times a day (BID) | ORAL | 0 refills | Status: AC

## 2021-12-06 MED ORDER — IBUPROFEN 800 MG PO TABS
800.0000 mg | ORAL_TABLET | Freq: Once | ORAL | Status: AC
  Administered 2021-12-06: 800 mg via ORAL
  Filled 2021-12-06: qty 1

## 2021-12-06 MED ORDER — KETOROLAC TROMETHAMINE 15 MG/ML IJ SOLN
15.0000 mg | Freq: Once | INTRAMUSCULAR | Status: AC
  Administered 2021-12-06: 15 mg via INTRAMUSCULAR
  Filled 2021-12-06: qty 1

## 2021-12-06 MED ORDER — AMOXICILLIN-POT CLAVULANATE 875-125 MG PO TABS: 1.0000 | ORAL_TABLET | Freq: Two times a day (BID) | ORAL | 0 refills | Status: AC

## 2021-12-06 NOTE — Discharge Instructions (Signed)
Please pick up augmentin. Stop taking the Doxycycline that was prescribed by urgent care. The augmentin should cover for both the sinus infection and the dental infection.   You can take Naproxen for dental pain. Do not take with ibuprofen.

## 2021-12-06 NOTE — ED Provider Triage Note (Signed)
Emergency Medicine Provider Triage Evaluation Note  Greg Patrick , a 41 y.o. male  was evaluated in triage.  Pt complains of dental pain. R lower dental pain x 2 days, worse with chewing. No fever, headache, neck pain  Review of Systems  Positive: As above Negative: As above  Physical Exam  BP (!) 165/118 (BP Location: Right Arm)   Pulse 81   Temp 97.7 F (36.5 C) (Oral)   Resp 17   Ht 5\' 11"  (1.803 m)   Wt 76.2 kg   SpO2 100%   BMI 23.43 kg/m  Gen:   Awake, no distress   Resp:  Normal effort  MSK:   Moves extremities without difficulty  Other:    Medical Decision Making  Medically screening exam initiated at 12:18 PM.  Appropriate orders placed.  Greg Patrick was informed that the remainder of the evaluation will be completed by another provider, this initial triage assessment does not replace that evaluation, and the importance of remaining in the ED until their evaluation is complete.     Shawnie Pons, PA-C 12/06/21 1219

## 2021-12-06 NOTE — ED Triage Notes (Signed)
Patient reports that he has right lower dental pain x 2 days.

## 2021-12-06 NOTE — ED Provider Notes (Signed)
[facility] Provider Note   CSN: [id number] Arrival date & time: 12/06/21  1124     History  Chief Complaint  Patient presents with   Dental Pain    Greg Patrick is a 41 y.o. male. Patient presents with dental pain for 3 days of the right posterior molar.  He says he feels like he has developed a small abscess pocket.  He says he was recently seen for sinus infection 2 days ago and sent home with doxycycline.  Pain has not been controlled with Tylenol at home.  No difficulty swallowing, eating, or drinking.  No fevers or chills.   Dental Pain      Home Medications Prior to Admission medications   Medication Sig Start Date End Date Taking? Authorizing Provider  amoxicillin-clavulanate (AUGMENTIN) 875-125 MG tablet Take 1 tablet by mouth every 12 (twelve) hours for 5 days. 12/06/21 12/11/21 Yes Divante Kotch, Finis Bud, PA-C  naproxen (NAPROSYN) 500 MG tablet Take 1 tablet (500 mg total) by mouth 2 (two) times daily for 5 days. 12/06/21 12/11/21 Yes Malachi Kinzler, Finis Bud, PA-C  aspirin EC 81 MG tablet Take 1 tablet (81 mg total) by mouth daily. Patient not taking: Reported on 04/03/2019 05/26/14   Dhungel, Theda Belfast, MD  methimazole (TAPAZOLE) 10 MG tablet Take 1 tablet (10 mg total) by mouth 2 (two) times daily. Patient not taking: Reported on 04/03/2019 09/15/16   Reather Littler, MD  predniSONE (DELTASONE) 20 MG tablet Take 1 tablet (20 mg total) by mouth daily for 4 days. 12/04/21 12/08/21  Raspet, Noberto Retort, PA-C  triamterene-hydrochlorothiazide (MAXZIDE-25) 37.5-25 MG tablet Take 0.5 tablets by mouth daily. Patient not taking: Reported on 04/03/2019 09/24/16   Reather Littler, MD      Allergies    Bee venom and Fish-derived products    Review of Systems   Review of Systems  HENT:  Positive for dental problem.   All other systems reviewed and are negative.   Physical Exam Updated Vital Signs BP (!) 165/118 (BP Location: Right Arm)   Pulse 81   Temp 97.7 F (36.5  C) (Oral)   Resp 17   Ht 5\' 11"  (1.803 m)   Wt 76.2 kg   SpO2 100%   BMI 23.43 kg/m  Physical Exam Vitals and nursing note reviewed.  Constitutional:      General: He is not in acute distress.    Appearance: Normal appearance. He is well-developed. He is not ill-appearing, toxic-appearing or diaphoretic.  HENT:     Head: Normocephalic and atraumatic.     Nose: No nasal deformity.     Mouth/Throat:     Lips: Pink. No lesions.      Comments: Small dental abscess, draining.  Tongue not raised, no sign of ludwigs Eyes:     General: Gaze aligned appropriately. No scleral icterus.       Right eye: No discharge.        Left eye: No discharge.     Conjunctiva/sclera: Conjunctivae normal.     Right eye: Right conjunctiva is not injected. No exudate or hemorrhage.    Left eye: Left conjunctiva is not injected. No exudate or hemorrhage. Pulmonary:     Effort: Pulmonary effort is normal. No respiratory distress.  Skin:    General: Skin is warm and dry.  Neurological:     Mental Status: He is alert and oriented to person, place, and time.  Psychiatric:        Mood and Affect: Mood normal.  Speech: Speech normal.        Behavior: Behavior normal. Behavior is cooperative.     ED Results / Procedures / Treatments   Labs (all labs ordered are listed, but only abnormal results are displayed) Labs Reviewed - No data to display  EKG None  Radiology No results found.  Procedures Procedures   Medications Ordered in ED Medications  ketorolac (TORADOL) 15 MG/ML injection 15 mg (has no administration in time range)  ibuprofen (ADVIL) tablet 800 mg (800 mg Oral Given 12/06/21 1223)    ED Course/ Medical Decision Making/ A&P                           Medical Decision Making Risk Prescription drug management.   Here for dental pain. Vitals stable. He has small dental abscess. It is already draining so no need to drain. No significant associated swelling, No evidence of  ludwigs. Tolerating secretions well. CT neck not indicated today. Will change doxy to augmentin for broader coverage. He has dentist he will follow up with. NSAIDs for pain  Final Clinical Impression(s) / ED Diagnoses Final diagnoses:  Pain, dental    Rx / DC Orders ED Discharge Orders          Ordered    naproxen (NAPROSYN) 500 MG tablet  2 times daily        12/06/21 1630    amoxicillin-clavulanate (AUGMENTIN) 875-125 MG tablet  Every 12 hours        12/06/21 1631              Claudie Leach, PA-C 12/06/21 1636    Bethann Berkshire, MD 12/07/21 1056

## 2022-04-03 ENCOUNTER — Emergency Department (HOSPITAL_COMMUNITY)
Admission: EM | Admit: 2022-04-03 | Discharge: 2022-04-03 | Disposition: A | Discharge status: Home / Self Care | Payer: Medicaid Other | Attending: Student | Admitting: Student

## 2022-04-03 ENCOUNTER — Encounter (HOSPITAL_COMMUNITY): Payer: Self-pay

## 2022-04-03 ENCOUNTER — Other Ambulatory Visit: Payer: Self-pay

## 2022-04-03 DIAGNOSIS — S0101XA Laceration without foreign body of scalp, initial encounter: Secondary | ICD-10-CM | POA: Diagnosis not present

## 2022-04-03 DIAGNOSIS — W010XXA Fall on same level from slipping, tripping and stumbling without subsequent striking against object, initial encounter: Secondary | ICD-10-CM | POA: Diagnosis not present

## 2022-04-03 DIAGNOSIS — Y92002 Bathroom of unspecified non-institutional (private) residence single-family (private) house as the place of occurrence of the external cause: Secondary | ICD-10-CM | POA: Diagnosis not present

## 2022-04-03 DIAGNOSIS — W19XXXA Unspecified fall, initial encounter: Secondary | ICD-10-CM

## 2022-04-03 DIAGNOSIS — J45909 Unspecified asthma, uncomplicated: Secondary | ICD-10-CM | POA: Insufficient documentation

## 2022-04-03 DIAGNOSIS — Z23 Encounter for immunization: Secondary | ICD-10-CM | POA: Insufficient documentation

## 2022-04-03 DIAGNOSIS — Z7982 Long term (current) use of aspirin: Secondary | ICD-10-CM | POA: Diagnosis not present

## 2022-04-03 DIAGNOSIS — S0990XA Unspecified injury of head, initial encounter: Secondary | ICD-10-CM | POA: Diagnosis present

## 2022-04-03 MED ORDER — TETANUS-DIPHTH-ACELL PERTUSSIS 5-2.5-18.5 LF-MCG/0.5 IM SUSY
0.5000 mL | PREFILLED_SYRINGE | Freq: Once | INTRAMUSCULAR | Status: AC
  Administered 2022-04-03: 0.5 mL via INTRAMUSCULAR
  Filled 2022-04-03: qty 0.5

## 2022-04-03 NOTE — Discharge Instructions (Signed)
You were seen in the emergency department for fall with head laceration.   We have closed your laceration(s) with staples. These need to be removed in 7 days. This can be done at any doctor's office, urgent care, or emergency department.   If any of the staples come out before it is time for removal, that is okay. Make sure to keep the area as clean and dry as possible. You can let warm soapy warm run over the area, but do NOT scrub it.   Watch out for signs of infection, like we discussed, including: increased redness, tenderness, or drainage of pus from the area. If this happens and you have not been prescribed an antibiotic, please seek medical attention for possible infection.   You can take over the counter pain medicine like ibuprofen or tylenol as needed.

## 2022-04-03 NOTE — ED Triage Notes (Signed)
I slipped and fell on the wet bathroom floor. Pt states he remembers everything that happened. Laceration noted on the occipital region of head. GCS 15.

## 2022-04-04 NOTE — ED Provider Notes (Signed)
Greg Patrick Provider Note   CSN: [id number] Arrival date & time: 04/03/22  [id number]     History  Chief Complaint  Patient presents with   Fall   Head Laceration    Greg Patrick is a 42 y.o. male with history of asthma, pancreatitis, and hyperthyroidism who presents the emergency department after mechanical fall at home with head laceration.  Patient states that his son had spilled some water on the floor, and he slipped, causing him to fall and hit the back of his head.  No loss of consciousness.  He is not on blood thinners.  Sustained a laceration to the back of his head.  Has otherwise been feeling well.  Remembers the whole event.  No nausea or vomiting.   Fall  Head Laceration       Home Medications Prior to Admission medications   Medication Sig Start Date End Date Taking? Authorizing Provider  aspirin EC 81 MG tablet Take 1 tablet (81 mg total) by mouth daily. Patient not taking: Reported on 04/03/2019 05/26/14   Dhungel, Flonnie Overman, MD  methimazole (TAPAZOLE) 10 MG tablet Take 1 tablet (10 mg total) by mouth 2 (two) times daily. Patient not taking: Reported on 04/03/2019 09/15/16   Elayne Snare, MD  triamterene-hydrochlorothiazide (MAXZIDE-25) 37.5-25 MG tablet Take 0.5 tablets by mouth daily. Patient not taking: Reported on 04/03/2019 09/24/16   Elayne Snare, MD      Allergies    Bee venom and Fish-derived products    Review of Systems   Review of Systems  Skin:  Positive for wound.  All other systems reviewed and are negative.   Physical Exam Updated Vital Signs BP (!) 109/91 (BP Location: Right Arm)   Pulse 88   Temp 98.2 F (36.8 C) (Oral)   Resp 18   Ht 5\' 11"  (1.803 m)   Wt 78 kg   SpO2 94%   BMI 23.99 kg/m  Physical Exam Vitals and nursing note reviewed.  Constitutional:      Appearance: Normal appearance.  HENT:     Head: Normocephalic.      Comments: Approximately 4 cm linear laceration noted to the  occiput of the head Eyes:     Conjunctiva/sclera: Conjunctivae normal.  Pulmonary:     Effort: Pulmonary effort is normal. No respiratory distress.  Skin:    General: Skin is warm and dry.  Neurological:     Mental Status: He is alert.     Comments: Neuro: Speech is clear, able to follow commands. CN III-XII intact grossly intact. PERRLA. EOMI. Sensation intact throughout. Str 5/5 all extremities.  Psychiatric:        Mood and Affect: Mood normal.        Behavior: Behavior normal.     ED Results / Procedures / Treatments   Labs (all labs ordered are listed, but only abnormal results are displayed) Labs Reviewed - No data to display  EKG None  Radiology No results found.  Procedures .Marland KitchenLaceration Repair  Date/Time: 04/04/2022 12:10 PM  Performed by: Kateri Plummer, PA-C Authorized by: Kateri Plummer, PA-C   Consent:    Consent obtained:  Verbal   Consent given by:  Patient   Risks, benefits, and alternatives were discussed: yes     Risks discussed:  Pain, need for additional repair and infection Universal protocol:    Patient identity confirmed:  Provided demographic data Anesthesia:    Anesthesia method:  None Laceration details:  Location:  Scalp   Scalp location:  Occipital   Length (cm):  4   Depth (mm):  3 Pre-procedure details:    Preparation:  Patient was prepped and draped in usual sterile fashion Exploration:    Hemostasis achieved with:  Direct pressure Treatment:    Area cleansed with:  Shur-Clens   Amount of cleaning:  Standard Skin repair:    Repair method:  Staples   Number of staples:  5 Approximation:    Approximation:  Close Repair type:    Repair type:  Simple Post-procedure details:    Dressing:  Open (no dressing)   Procedure completion:  Tolerated well, no immediate complications     Medications Ordered in ED Medications  Tdap (BOOSTRIX) injection 0.5 mL (0.5 mLs Intramuscular Given 04/03/22 2113)    ED Course/  Medical Decision Making/ A&P                             Medical Decision Making Risk Prescription drug management.   Patient is a 42 year old male with history of asthma, pancreatitis, and thyroid disease who presents the emergency department after mechanical fall with head laceration.  Not on blood thinners.  On exam normal vital signs, no acute distress.  Approximately 4 cm linear laceration noted to the occiput of the head, mild bleeding.  GCS 15, no facial droop, no slurred speech, moving all extremities without difficulty.  Discussed CT head criteria with the patient per [place] head CT rule.  I have low suspicion for acute intracranial abnormality, and thus will defer imaging at this time.  Patient in agreement with this.  Laceration repaired with 5 staples, patient tolerated procedure well.  Tetanus updated.  Patient not requiring admission today.  Will discharge to home with recommendations for wound care, and return to ER for staple removal in 1 week.  Has no comorbidities to increase his risk of infection, and will be discharged without antibiotics.  Patient discharged in stable condition all questions answered.        Final Clinical Impression(s) / ED Diagnoses Final diagnoses:  Fall, initial encounter  Laceration of scalp without foreign body, initial encounter    Rx / DC Orders ED Discharge Orders     None      Portions of this report may have been transcribed using voice recognition software. Every effort was made to ensure accuracy; however, inadvertent computerized transcription errors may be present.    Estill Cotta 04/04/22 1211    Sherwood Gambler, MD 04/08/22 [phone]

## 2022-04-10 ENCOUNTER — Encounter (HOSPITAL_COMMUNITY): Payer: Self-pay | Admitting: *Deleted

## 2022-04-10 ENCOUNTER — Ambulatory Visit (HOSPITAL_COMMUNITY)
Admission: EM | Admit: 2022-04-10 | Discharge: 2022-04-10 | Disposition: A | Discharge status: Home / Self Care | Payer: Medicaid Other

## 2022-04-10 DIAGNOSIS — S0101XA Laceration without foreign body of scalp, initial encounter: Secondary | ICD-10-CM

## 2022-04-10 DIAGNOSIS — Z4802 Encounter for removal of sutures: Secondary | ICD-10-CM | POA: Diagnosis not present

## 2022-04-10 NOTE — Discharge Instructions (Signed)
Continue wound care and pain control if needed Gentle shampooing/brushing of the area until skin has fully healed Wait about a week until haircut

## 2022-04-10 NOTE — ED Provider Notes (Signed)
[facility]    CSN: [id number] Arrival date & time: 04/10/22  1253      History   Chief Complaint Chief Complaint  Patient presents with   Suture / Staple Removal    HPI Greg Patrick is a 42 y.o. male.  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 No pertinent surgical history.     Home Medications    Prior to Admission medications   Medication Sig Start Date End Date Taking? Authorizing Provider  aspirin EC 81 MG tablet Take 1 tablet (81 mg total) by mouth daily. Patient not taking: Reported on 04/03/2019 05/26/14   Dhungel, Theda Belfast, MD  methimazole (TAPAZOLE) 10 MG tablet Take 1 tablet (10 mg total) by mouth 2 (two) times daily. Patient not taking: Reported on 04/03/2019 09/15/16   Reather Littler, MD  triamterene-hydrochlorothiazide (MAXZIDE-25) 37.5-25 MG tablet Take 0.5 tablets by mouth daily. Patient not taking: Reported on 04/03/2019 09/24/16   Reather Littler, MD    Family History Family History  Problem Relation Age of Onset   Deep vein thrombosis Mother    Hypertension Father     Social History Social History   Tobacco Use   Smoking status: Former    Packs/day: 0.25    Years: 25.00    Additional pack years: 0.00    Total pack years: 6.25    Types: Cigarettes    Quit date: 12/11/2020    Years since quitting: 1.3   Smokeless tobacco: Never  Vaping Use   Vaping Use: Never used  Substance Use Topics   Alcohol use: Yes    Alcohol/week: 2.0 standard drinks of alcohol    Types: 2 Shots  of liquor per week    Comment: Socially   Drug use: No     Allergies   Bee venom and Fish-derived products   Review of Systems Review of Systems As per HPI  Physical Exam Triage Vital Signs ED Triage Vitals  Enc Vitals Group     BP 04/10/22 1318 (!) 166/104     Pulse Rate 04/10/22 1318 79     Resp 04/10/22 1318 16     Temp 04/10/22 1318 98 F (36.7 C)     Temp Source 04/10/22 1318 Oral     SpO2 04/10/22 1318 97 %     Weight --      Height --      Head Circumference --      Peak Flow --      Pain Score 04/10/22 1319 0     Pain Loc --      Pain Edu? --      Excl. in GC? --    No data found.  Updated Vital Signs BP (!) 166/104   Pulse 79   Temp 98 F (36.7 C) (Oral)   Resp 16   SpO2 97%   Visual Acuity Right Eye Distance:   Left Eye Distance:   Bilateral Distance:    Right  Eye Near:   Left Eye Near:    Bilateral Near:     Physical Exam Vitals and nursing note reviewed.  HENT:     Head:      Comments: 5 staples in place on posterior head. No sign of infection  Cardiovascular:     Rate and Rhythm: Normal rate and regular rhythm.  Pulmonary:     Effort: Pulmonary effort is normal.  Neurological:     Mental Status: He is alert and oriented to person, place, and time.      UC Treatments / Results  Labs (all labs ordered are listed, but only abnormal results are displayed) Labs Reviewed - No data to display  EKG   Radiology No results found.  Procedures Procedures (including critical care time)  Medications Ordered in UC Medications - No data to display  Initial Impression / Assessment and Plan / UC Course  I have reviewed the triage vital signs and the nursing notes.  Pertinent labs & imaging results that were available during my care of the patient were reviewed by me and considered in my medical decision making (see chart for details).  Healing well. No concern for infection Discussed continued wound care, follow up as  needed Patient wondering when he can get a haircut. Discussed wait at least a week for rest of skin to heal  Final Clinical Impressions(s) / UC Diagnoses   Final diagnoses:  Encounter for staple removal  Laceration of scalp without foreign body, initial encounter     Discharge Instructions      Continue wound care and pain control if needed Gentle shampooing/brushing of the area until skin has fully healed Wait about a week until haircut     ED Prescriptions   None    PDMP not reviewed this encounter.   Marlow Baars, [place] 04/10/22 1334

## 2022-04-10 NOTE — ED Triage Notes (Signed)
Pt had staples placed to posterior scalp 04/03/22; presents for removal of staples. Wound well-approximated without S/S infection.

## 2023-11-13 ENCOUNTER — Emergency Department (HOSPITAL_COMMUNITY)
Admission: EM | Admit: 2023-11-13 | Discharge: 2023-11-13 | Disposition: A | Discharge status: Home / Self Care | Attending: Emergency Medicine | Admitting: Emergency Medicine

## 2023-11-13 ENCOUNTER — Other Ambulatory Visit: Payer: Self-pay

## 2023-11-13 ENCOUNTER — Encounter (HOSPITAL_COMMUNITY): Payer: Self-pay | Admitting: *Deleted

## 2023-11-13 DIAGNOSIS — H538 Other visual disturbances: Secondary | ICD-10-CM | POA: Insufficient documentation

## 2023-11-13 DIAGNOSIS — I10 Essential (primary) hypertension: Secondary | ICD-10-CM | POA: Diagnosis not present

## 2023-11-13 DIAGNOSIS — Z7982 Long term (current) use of aspirin: Secondary | ICD-10-CM | POA: Insufficient documentation

## 2023-11-13 NOTE — ED Provider Notes (Signed)
 [facility] DEPARTMENT AT [facility] Provider Note   CSN: [id number] Arrival date & time: 11/13/23  1203     Patient presents with: Blurred Vision   Greg Patrick is a 43 y.o. male who presents ED today out of concern for blurred vision in the left eye.  He states it looks like spots in his left field of vision are decreased, and he has globally decreased vision in the left eye.  No pain in either eye.  Denies having any other associated symptoms such as nausea/vomiting, no other weakness or focal deficits.  Primary concern is of a retinal tear by the patient.   HPI     Prior to Admission medications   Medication Sig Start Date End Date Taking? Authorizing Provider  aspirin  EC 81 MG tablet Take 1 tablet (81 mg total) by mouth daily. Patient not taking: Reported on 04/03/2019 05/26/14   Dhungel, Barnetta, MD  methimazole  (TAPAZOLE ) 10 MG tablet Take 1 tablet (10 mg total) by mouth 2 (two) times daily. Patient not taking: Reported on 04/03/2019 09/15/16   Von [place], MD  triamterene -hydrochlorothiazide (MAXZIDE-25) 37.5-25 MG tablet Take 0.5 tablets by mouth daily. Patient not taking: Reported on 04/03/2019 09/24/16   Von [place], MD    Allergies: Bee venom and Fish protein-containing drug products    Review of Systems  Eyes:  Positive for visual disturbance.  All other systems reviewed and are negative.   Updated Vital Signs BP (!) 170/105   Pulse 63   Temp 98.8 F (37.1 C) (Oral)   Resp 16   Ht 5' 11 (1.803 m)   Wt 72.6 kg   SpO2 100%   BMI 22.32 kg/m   Physical Exam Vitals and nursing note reviewed.  Constitutional:      General: He is not in acute distress.    Appearance: Normal appearance.  HENT:     Head: Normocephalic and atraumatic.     Mouth/Throat:     Mouth: Mucous membranes are moist.     Pharynx: Oropharynx is clear.  Eyes:     Extraocular Movements: Extraocular movements intact.     Conjunctiva/sclera: Conjunctivae normal.      Pupils: Pupils are equal, round, and reactive to light.     Funduscopic exam:       Left eye: No hemorrhage or papilledema.  Cardiovascular:     Rate and Rhythm: Normal rate and regular rhythm.     Pulses: Normal pulses.     Heart sounds: Normal heart sounds. No murmur heard.    No friction rub. No gallop.  Pulmonary:     Effort: Pulmonary effort is normal.     Breath sounds: Normal breath sounds.  Abdominal:     General: Abdomen is flat. Bowel sounds are normal.     Palpations: Abdomen is soft.  Musculoskeletal:        General: Normal range of motion.     Cervical back: Normal range of motion and neck supple.     Right lower leg: No edema.     Left lower leg: No edema.  Skin:    General: Skin is warm and dry.     Capillary Refill: Capillary refill takes less than 2 seconds.  Neurological:     General: No focal deficit present.     Mental Status: He is alert. Mental status is at baseline.  Psychiatric:        Mood and Affect: Mood normal.     (all labs ordered  are listed, but only abnormal results are displayed) Labs Reviewed - No data to display  EKG: None  Radiology: No results found.   Procedures   Medications Ordered in the ED - No data to display                                  Medical Decision Making  Given this patient's presenting signs and symptoms of monocular vision loss, consider potential central retinal artery occlusion however there is no noted profound vision loss, patient has intact peripheral vision.  Given his previous diagnosis of hypertension, can can also consider possible diabetic retinopathy.  At this time plan is to refer to outpatient visit to ophthalmology, and have patient follow-up as an outpatient.  Since there is no other concerning signs or symptoms, cranial nerve exam is unremarkable other than for vision impairment in the left eye but he is stable for discharge and outpatient management at this time.  Careful return precautions given  to the patient which he understands and agrees.     Final diagnoses:  Blurred vision, right eye    ED Discharge Orders     None          Myriam Dorn BROCKS, [place] 11/13/23 1424    Geraldene Hamilton, MD 11/15/23 1325

## 2023-11-13 NOTE — ED Triage Notes (Signed)
 Here by POV from home for blurred vision in L eye. Onset yesterday morning at 6am. Denies injury, fall, or other sx. PERRL. EOMI. Denies pain. Alert, NAD, calm, interactive, resps e/u, speaking in clear complete sentences, skin W&D. Steady gait.

## 2023-11-15 ENCOUNTER — Encounter (INDEPENDENT_AMBULATORY_CARE_PROVIDER_SITE_OTHER): Admitting: Ophthalmology

## 2023-11-15 DIAGNOSIS — H43813 Vitreous degeneration, bilateral: Secondary | ICD-10-CM | POA: Diagnosis not present

## 2023-11-15 DIAGNOSIS — H2513 Age-related nuclear cataract, bilateral: Secondary | ICD-10-CM

## 2023-11-15 DIAGNOSIS — I10 Essential (primary) hypertension: Secondary | ICD-10-CM

## 2023-11-15 DIAGNOSIS — H34812 Central retinal vein occlusion, left eye, with macular edema: Secondary | ICD-10-CM

## 2023-11-15 DIAGNOSIS — H35033 Hypertensive retinopathy, bilateral: Secondary | ICD-10-CM

## 2023-11-22 ENCOUNTER — Encounter (INDEPENDENT_AMBULATORY_CARE_PROVIDER_SITE_OTHER): Admitting: Ophthalmology

## 2023-11-22 DIAGNOSIS — H34812 Central retinal vein occlusion, left eye, with macular edema: Secondary | ICD-10-CM | POA: Diagnosis not present

## 2023-11-29 ENCOUNTER — Encounter: Payer: Self-pay | Admitting: Endocrinology

## 2023-11-29 ENCOUNTER — Ambulatory Visit (INDEPENDENT_AMBULATORY_CARE_PROVIDER_SITE_OTHER): Admitting: Endocrinology

## 2023-11-29 VITALS — BP 172/90 | HR 64 | Resp 16 | Ht 71.0 in | Wt 174.6 lb

## 2023-11-29 DIAGNOSIS — D72819 Decreased white blood cell count, unspecified: Secondary | ICD-10-CM | POA: Diagnosis not present

## 2023-11-29 DIAGNOSIS — I10 Essential (primary) hypertension: Secondary | ICD-10-CM

## 2023-11-29 DIAGNOSIS — E0590 Thyrotoxicosis, unspecified without thyrotoxic crisis or storm: Secondary | ICD-10-CM

## 2023-11-29 DIAGNOSIS — E0500 Thyrotoxicosis with diffuse goiter without thyrotoxic crisis or storm: Secondary | ICD-10-CM | POA: Diagnosis not present

## 2023-11-29 MED ORDER — METHIMAZOLE 5 MG PO TABS: 5.0000 mg | ORAL_TABLET | Freq: Every day | ORAL | 3 refills | Status: DC

## 2023-11-29 NOTE — Progress Notes (Addendum)
 Outpatient Endocrinology Note [name], MD   Patient's Name: Greg Patrick    DOB: September 26, 1980    MRN: [id number]  REASON OF VISIT: New consult for hyperthyroidism  REFERRING PROVIDER: Arleta Dad, MD   PCP: Patient, No Pcp Per  HISTORY OF PRESENT ILLNESS:   Greg Patrick is a 43 y.o. old male with past medical history as listed below is presented for new consult for hyperthyroidism / Graves disease.   Pertinent Thyroid  History: Patient is referred to endocrinology for evaluation and management of hyperthyroidism, initial consult on November 29, 2023.  Patient recently established care with primary care provider at [facility], and referred to endocrinology for hyperthyroidism.  Records reviewed in the chart including at Care Everywhere.  Patient is known to have history of Graves' disease causing hyperthyroidism, diagnosed in May 2016, at the time of diagnosis she had significantly elevated thyroid  hormone levels, he was treated with methimazole .  He was seen in this clinic by Dr. Von and was last time seen in February 2019.  He had elevated TRAb and TSI and RAI uptake and scan consistent with Graves' disease.  He had elevated anti-TPO antibody and thyroglobulin antibody as well consistent with Hashimoto thyroiditis in 2016.   Hyperthyroidism was treated with RAI I-131 with 24.7 mCi in April 2018.  He was treated with levothyroxine  for short time in 2018, however had reoccurrence of hyperthyroidism by the end of 2018, levothyroxine  was stopped and started on methimazole .  He had RAI uptake and scan in February 2019 with 24 hr uptake was elevated 62%, had elevated TRAb consistent with persistent Graves' disease causing hyperthyroidism.  There was a plan for second time treatment with RAI I-131 in 02/2017, however patient did not have treatment.  He lost follow-up with endocrinology.  He stopped taking methimazole  by himself after few months of February 2019.  He reports he had  significant decrease in size of goiter, felt better and did not have symptoms and he stopped taking methimazole  and also did not continue follow-ups.  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  He denies redness or watering of the eyes.  No bulging of the eyes or diplopia.    Patient later established care with primary care provider at [facility] for blood pressure management and with having history of Graves' disease, thyroid  function test was checked.  He was found to have suppressed TSH with mildly elevated free T4 and free T3 consistent with hyperthyroidism.  Patient denies symptoms of palpitation or heat intolerance.  He has relatively stable body weight.  No change in bowel habit or diarrhea.  He denies family history of thyroid  disorder.  No neck discomfort.  No eye symptoms.  Denies taking biotin or other multivitamin supplement.  No IV iodine contrast exposure recently.  No intake of amiodarone.  With a chart review recent laboratory results on November 23, 2023 at [facility] showed low WBC of 3.30 and low neutrophil neutrophil count of 1.70.  He has acceptable liver enzymes with mild elevation of alkaline phosphatase and total bilirubin.   [facility]11/19/2025 Component 11/23/2023 11/23/2023 11/23/2023 11/23/2023        T3, Free 4.58 High    -- -- --  Hemoglobin A1c -- 5.9 High     -- --  Estimated Average Glucose -- 123 -- --  TSH -- -- <0.050 Low    --  T4, Free -- -- -- 1.2 High       Latest Reference Range & Units 05/25/14 05:51 06/23/15 16:44 09/10/15 13:50 01/21/17 13:10  Thyrotropin Receptor Ab 0.00 - 1.75 IU/L  2.13 (H) 1.41 5.17 (H)  TSI <140 % baseline 496 (H)     (H): Data is abnormally high   Latest Reference Range & Units 05/25/14 05:51  Thyroperoxidase Ab SerPl-aCnc 0 - 34 IU/mL >600 (H)  Thyroglobulin  Antibody 0.0 - 0.9 IU/mL 5.6 (H)  (H): Data is abnormally high  [facility]11/19/2025 Component 11/23/2023     WBC 3.30 Low     RBC 5.77  Hemoglobin 17.5  Hematocrit 51.4 High     Mean Corpuscular Volume (MCV) 89.0  Mean Corpuscular Hemoglobin (MCH) 30.4  Mean Corpuscular Hemoglobin Conc (MCHC) 34.1  Red Cell Distribution Width (RDW) 13.0  Platelet Count (PLT) 197  Mean Platelet Volume (MPV) 9.7  Neutrophils % 52  Lymphocytes % 30  Monocytes % 14  Eosinophils % 4  Basophils % 1  nRBC % 1  Neutrophils Absolute 1.70 Low     Lymphocytes # 1.00  Monocytes # 0.40  Eosinophils # 0.10    Interval history  Initial visit today.  Presented for evaluation and management of hyperthyroidism.  REVIEW OF SYSTEMS:  As per history of present illness.   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 No pertinent surgical history.  ALLERGIES: Allergies  Allergen Reactions   Bee Venom Anaphylaxis   Fish Protein-Containing Drug Products Anaphylaxis    FAMILY HISTORY:  Family History  Problem Relation Age of Onset   Deep vein thrombosis Mother    Hypertension Father     SOCIAL HISTORY: Social History   Socioeconomic History   Marital status: Single    Spouse name: Not on file   Number of children: Not on file   Years of education: Not on file   Highest education level: Not on file  Occupational History   Not on file  Tobacco Use   Smoking status: Former    Current packs/day: 0.00    Average packs/day: 0.3 packs/day for 25.0 years (6.3 ttl pk-yrs)    Types: Cigarettes    Start date: 12/12/1995    Quit date: 12/11/2020    Years since quitting: 2.9   Smokeless tobacco: Never  Vaping Use   Vaping status: Never Used  Substance and Sexual Activity   Alcohol use: Yes    Alcohol/week: 2.0 standard drinks of alcohol    Types:  2 Shots of liquor per week    Comment: Socially   Drug use: No   Sexual activity: Not on file  Other Topics Concern   Not on file  Social History Narrative   Not on file   Social Drivers of Health   Financial Resource Strain: Not on file  Food Insecurity: Low Risk  (11/23/2023)   Received from [facility]   Hunger Vital Sign    Within the past 12 months, you worried that your food would run out before you got money to buy more: Never true    Within the past 12 months, the food you bought just didn't last and you didn't have money to get more. : Never true  Transportation Needs: No Transportation Needs (11/23/2023)   Received from [facility]  In the past 12 months, has lack of reliable transportation kept you from medical appointments, meetings, work or from getting things needed for daily living? : No  Physical Activity: Not on file  Stress: Not on file  Social Connections: Not on file    MEDICATIONS:  Current Outpatient Medications  Medication Sig Dispense Refill   aspirin  EC 81 MG tablet Take 1 tablet (81 mg total) by mouth daily. 30 tablet 0   triamterene -hydrochlorothiazide (MAXZIDE-25) 37.5-25 MG tablet Take 0.5 tablets by mouth daily. 45 tablet 1   No current facility-administered medications for this visit.    PHYSICAL EXAM: Vitals:   11/29/23 1101 11/29/23 1103  BP: (!) 170/110 (!) 172/90  Pulse: 64   Resp: 16   SpO2: 99%   Weight: 174 lb 9.6 oz (79.2 kg)   Height: 5' 11 (1.803 m)    Body mass index is 24.35 kg/m.  Wt Readings from Last 3 Encounters:  11/29/23 174 lb 9.6 oz (79.2 kg)  11/13/23 160 lb (72.6 kg)  04/03/22 172 lb (78 kg)    General: Well developed, well nourished male in no apparent distress.  HEENT: AT/[place], no external lesions. Hearing intact to the spoken word Eyes: EOMI. No stare, proptosis or lid lag. Conjunctiva clear and no icterus. No erythema or watering Neck: Trachea midline, neck supple without appreciable  thyromegaly or lymphadenopathy and no palpable thyroid  nodules Lungs: Clear to auscultation, no wheeze. Respirations not labored Heart: S1S2, Regular in rate and rhythm.  Abdomen: Soft, non tender Neurologic: Alert, oriented, normal speech, deep tendon biceps reflexes normal,  no gross focal neurological deficit Extremities: No pedal pitting edema, no tremors of outstretched hands Skin: Warm, color good.  Psychiatric: Does not appear depressed or anxious  PERTINENT HISTORIC LABORATORY AND IMAGING STUDIES:  All pertinent laboratory results were reviewed. Please see HPI also for further details.   TSH  Date Value Ref Range Status  01/21/2017 <0.01 (L) 0.35 - 4.50 uIU/mL Final  12/06/2016 7.12 (H) 0.35 - 4.50 uIU/mL Final  09/13/2016 <0.01 (L) 0.35 - 4.50 uIU/mL Final   T3, Total  Date Value Ref Range Status  05/24/2014 >651 (H) 71 - 180 ng/dL Final    Comment:    (NOTE) Performed At: [facility] [address] [place], [place] [id number] Garwin Elsie FALCON MD Ey:[id number]     Lab Results  Component Value Date   FREET4 2.30 (H) 01/21/2017   FREET4 0.33 (L) 12/06/2016   FREET4 2.74 (H) 09/13/2016   T3FREE 2.7 12/06/2016   T3FREE 9.4 (H) 03/26/2016   T3FREE 3.9 08/04/2015   TSH <0.01 (L) 01/21/2017   TSH 7.12 (H) 12/06/2016   TSH <0.01 (L) 09/13/2016    Lab Results  Component Value Date   THYROTRECAB 5.17 (H) 01/21/2017   THYROTRECAB 1.41 09/10/2015   THYROTRECAB 2.13 (H) 06/23/2015    Lab Results  Component Value Date   TSH <0.01 (L) 01/21/2017   TSH 7.12 (H) 12/06/2016   TSH <0.01 (L) 09/13/2016   FREET4 2.30 (H) 01/21/2017   FREET4 0.33 (L) 12/06/2016   FREET4 2.74 (H) 09/13/2016     Lab Results  Component Value Date   TSI 496 (H) 05/25/2014     No components found for: TRAB    ASSESSMENT / PLAN  1. Graves disease   2. Hyperthyroidism   3. Primary hypertension   4. Leukopenia, unspecified type    Patient is known to have Graves' disease  causing hyperthyroidism, diagnosed initially in May 2016,  he was treated with methimazole  in the past including RAI I-131 treatment for hyperthyroidism in April 2018.  He had reoccurrence of hyperthyroidism by the end of 2018, methimazole  was restarted and there was a plan to treat second dose of RAI I-131, patient did not complete treatment and lost follow-up with endocrinology.  He stopped taking methimazole  after few months.  RAI uptake and scan in February 2019 was elevated uptake 62% with elevated TRAb consistent with persistent Graves' disease.  He also had elevated anti-TPO antibody and thyroglobulin antibody in 2016 consistent with Hashimoto thyroiditis.  He recently reestablished PCP care and had thyroid  function test at [facility], showed suppressed TSH with mildly elevated free T4 and free T3 consistent with hyperthyroidism.  He has no obvious hyperthyroid symptoms, he is clinically euthyroid.  No other obvious reasons including IV iodine contrast use, amiodarone or biotin use to cause elevated thyroid  hormone levels.  Recently had leukopenia with mildly low WBC and absolute neutrophil count, unclear reason.  Patient has elevated blood pressure today, he denies chest pain, palpitation, headache.  He has been following with primary care provider for blood pressure management.  Advised to monitor blood pressure at home and discussed with primary care provider, he has follow-up appointment on December 3.  Plan: - Patient has mild hyperthyroidism based on recent lab, he is asymptomatic.  Most likely he has persistent Graves' disease causing mild hyperthyroidism.  Since he has leukopenia, I will hold off on plan to start antithyroid medication/methimazole  for now.   -Will start methimazole  after improvement on his blood count and if he remained hyperthyroid. - At this time we will monitor thyroid  function test with a plan to recheck thyroid  lab along with CBC with differential in 6 to 8 weeks. -  Will check thyroid  autoantibodies for Graves' disease TRAb and TSI with next set of lab.  The three options of therapy for hyperthyroidism were discussed with the patient, including thionamide drug therapy, thyroidectomy, and radioactive iodine ablation. I reviewed the possible complications of rash, agranulocytosis, or liver dysfunction associated with anti-thyroid  therapies. I reviewed the risks of hemorrhage, hypocalcemia, hoarseness and hypothyroidism after thyroidectomy. Regarding radioactive iodine ablation, I informed the patient that most patients treated with radioactive iodine can be cured of hyperthyroidism with a single dose, but to about 15-20% may require an additional dose. I emphasized that most patients treated with radioactive iodine develop permanent hypothyroidism, requiring lifelong replacement with thyroid  hormone. I discussed the rare occurrence of transient increase in thyroid  hormone levels after radioactive iodine and associated with symptoms, possible worsening of Grave's eye disease, and the likely small but not insignificant risks associated with radiation exposure of this kind.    Treatment plan as above.  If needed he will consider repeat RAI therapy however he he wants to avoid thyroid  surgery as a permanent treatment option.  Diagnoses and all orders for this visit:  Graves disease -     Discontinue: methimazole  (TAPAZOLE ) 5 MG tablet; Take 1 tablet (5 mg total) by mouth daily. -     T4, free -     T3, free -     TSH -     Thyroid  stimulating immunoglobulin -     TRAb (TSH Receptor Binding Antibody)  Hyperthyroidism  Primary hypertension  Leukopenia, unspecified type -     CBC with Differential/Platelet    DISPOSITION Follow up in clinic in 6-8 weeks suggested.  Labs prior to follow-up visit.  All questions answered and patient verbalized understanding  of the plan.  Finesse Fielder, MD [facility] Endocrinology [facility] [address] [place],  [address] [place], [place] [address] Phone # [phone]   At least part of this note was generated using voice recognition software. Inadvertent word errors may have occurred, which were not recognized during the proofreading process.

## 2023-12-20 ENCOUNTER — Encounter (INDEPENDENT_AMBULATORY_CARE_PROVIDER_SITE_OTHER): Admitting: Ophthalmology

## 2023-12-20 DIAGNOSIS — H43813 Vitreous degeneration, bilateral: Secondary | ICD-10-CM | POA: Diagnosis not present

## 2023-12-20 DIAGNOSIS — I10 Essential (primary) hypertension: Secondary | ICD-10-CM | POA: Diagnosis not present

## 2023-12-20 DIAGNOSIS — H34812 Central retinal vein occlusion, left eye, with macular edema: Secondary | ICD-10-CM | POA: Diagnosis not present

## 2023-12-20 DIAGNOSIS — H35033 Hypertensive retinopathy, bilateral: Secondary | ICD-10-CM | POA: Diagnosis not present

## 2024-01-23 ENCOUNTER — Other Ambulatory Visit

## 2024-01-23 LAB — CBC WITH DIFFERENTIAL/PLATELET
Absolute Lymphocytes: 969 {cells}/uL (ref 850–3900)
Absolute Monocytes: 351 {cells}/uL (ref 200–950)
Basophils Absolute: 29 {cells}/uL (ref 0–200)
Basophils Relative: 1 %
Eosinophils Absolute: 209 {cells}/uL (ref 15–500)
Eosinophils Relative: 7.2 %
HCT: 45 % (ref 39.4–51.1)
Hemoglobin: 15.1 g/dL (ref 13.2–17.1)
MCH: 30.3 pg (ref 27.0–33.0)
MCHC: 33.6 g/dL (ref 31.6–35.4)
MCV: 90.4 fL (ref 81.4–101.7)
MPV: 10.6 fL (ref 7.5–12.5)
Monocytes Relative: 12.1 %
Neutro Abs: 1343 {cells}/uL — ABNORMAL LOW (ref 1500–7800)
Neutrophils Relative %: 46.3 %
Platelets: 231 Thousand/uL (ref 140–400)
RBC: 4.98 Million/uL (ref 4.20–5.80)
RDW: 12.8 % (ref 11.0–15.0)
Total Lymphocyte: 33.4 %
WBC: 2.9 Thousand/uL — ABNORMAL LOW (ref 3.8–10.8)

## 2024-01-27 ENCOUNTER — Ambulatory Visit: Payer: Self-pay | Admitting: Endocrinology

## 2024-01-27 DIAGNOSIS — E0590 Thyrotoxicosis, unspecified without thyrotoxic crisis or storm: Secondary | ICD-10-CM

## 2024-01-27 LAB — T4, FREE: Free T4: 1.5 ng/dL (ref 0.8–1.8)

## 2024-01-27 LAB — T3, FREE: T3, Free: 4.2 pg/mL (ref 2.3–4.2)

## 2024-01-27 LAB — THYROID STIMULATING IMMUNOGLOBULIN: TSI: 112 %{baseline}

## 2024-01-27 LAB — TSH: TSH: <0.01 m[IU]/L — ABNORMAL LOW (ref 0.40–4.50)

## 2024-01-27 LAB — TRAB (TSH RECEPTOR BINDING ANTIBODY): TRAB: <1 IU/L

## 2024-01-30 ENCOUNTER — Ambulatory Visit: Admitting: Endocrinology

## 2024-01-31 ENCOUNTER — Encounter (INDEPENDENT_AMBULATORY_CARE_PROVIDER_SITE_OTHER): Payer: Self-pay | Admitting: Ophthalmology

## 2024-02-06 ENCOUNTER — Other Ambulatory Visit

## 2024-02-07 ENCOUNTER — Encounter (INDEPENDENT_AMBULATORY_CARE_PROVIDER_SITE_OTHER): Payer: Self-pay | Admitting: Ophthalmology

## 2024-03-20 ENCOUNTER — Ambulatory Visit: Admitting: Endocrinology
